# Patient Record
Sex: Female | Born: 1975 | Race: Black or African American | Hispanic: No | Smoking: Never smoker
Health system: Southern US, Community
[De-identification: ages and names within clinical notes are randomized; demographics above are authoritative.]

## PROBLEM LIST (undated history)

## (undated) DIAGNOSIS — R51 Headache: Secondary | ICD-10-CM

## (undated) DIAGNOSIS — E039 Hypothyroidism, unspecified: Secondary | ICD-10-CM

## (undated) DIAGNOSIS — L732 Hidradenitis suppurativa: Secondary | ICD-10-CM

## (undated) DIAGNOSIS — K219 Gastro-esophageal reflux disease without esophagitis: Secondary | ICD-10-CM

## (undated) DIAGNOSIS — J42 Unspecified chronic bronchitis: Secondary | ICD-10-CM

## (undated) DIAGNOSIS — K589 Irritable bowel syndrome without diarrhea: Secondary | ICD-10-CM

## (undated) DIAGNOSIS — F32A Depression, unspecified: Secondary | ICD-10-CM

## (undated) DIAGNOSIS — F419 Anxiety disorder, unspecified: Secondary | ICD-10-CM

## (undated) DIAGNOSIS — R519 Headache, unspecified: Secondary | ICD-10-CM

## (undated) DIAGNOSIS — F329 Major depressive disorder, single episode, unspecified: Secondary | ICD-10-CM

## (undated) HISTORY — DX: Hidradenitis suppurativa: L73.2

## (undated) HISTORY — PX: DILATION AND CURETTAGE OF UTERUS: SHX78

## (undated) HISTORY — PX: HERNIA REPAIR: SHX51

## (undated) HISTORY — DX: Hypothyroidism, unspecified: E03.9

## (undated) HISTORY — DX: Major depressive disorder, single episode, unspecified: F32.9

## (undated) HISTORY — DX: Anxiety disorder, unspecified: F41.9

## (undated) HISTORY — DX: Irritable bowel syndrome, unspecified: K58.9

## (undated) HISTORY — DX: Depression, unspecified: F32.A

---

## 2011-06-13 HISTORY — PX: ABDOMINAL HYSTERECTOMY: SHX81

## 2013-06-12 HISTORY — PX: ABDOMINAL HYSTERECTOMY: SHX81

## 2017-08-01 ENCOUNTER — Other Ambulatory Visit: Payer: Self-pay | Admitting: Physician Assistant

## 2017-08-01 DIAGNOSIS — R110 Nausea: Secondary | ICD-10-CM

## 2017-08-01 DIAGNOSIS — R1031 Right lower quadrant pain: Secondary | ICD-10-CM

## 2017-08-01 DIAGNOSIS — R1032 Left lower quadrant pain: Secondary | ICD-10-CM

## 2017-08-01 DIAGNOSIS — R1013 Epigastric pain: Secondary | ICD-10-CM

## 2017-08-09 ENCOUNTER — Ambulatory Visit
Admission: RE | Admit: 2017-08-09 | Discharge: 2017-08-09 | Disposition: A | Discharge status: Home / Self Care | Payer: BLUE CROSS/BLUE SHIELD | Source: Ambulatory Visit | Attending: Physician Assistant | Admitting: Physician Assistant

## 2017-08-09 DIAGNOSIS — R1031 Right lower quadrant pain: Secondary | ICD-10-CM

## 2017-08-09 DIAGNOSIS — R1032 Left lower quadrant pain: Secondary | ICD-10-CM

## 2017-08-09 DIAGNOSIS — R1013 Epigastric pain: Secondary | ICD-10-CM

## 2017-08-09 DIAGNOSIS — R110 Nausea: Secondary | ICD-10-CM

## 2017-08-09 MED ORDER — IOPAMIDOL (ISOVUE-300) INJECTION 61%
125.0000 mL | Freq: Once | INTRAVENOUS | Status: AC | PRN
  Administered 2017-08-09: 125 mL via INTRAVENOUS

## 2017-08-14 ENCOUNTER — Other Ambulatory Visit: Payer: Self-pay | Admitting: Physician Assistant

## 2017-08-14 DIAGNOSIS — K449 Diaphragmatic hernia without obstruction or gangrene: Secondary | ICD-10-CM

## 2017-08-15 ENCOUNTER — Ambulatory Visit
Admission: RE | Admit: 2017-08-15 | Discharge: 2017-08-15 | Disposition: A | Discharge status: Home / Self Care | Payer: BLUE CROSS/BLUE SHIELD | Source: Ambulatory Visit | Attending: Physician Assistant | Admitting: Physician Assistant

## 2017-08-15 DIAGNOSIS — K449 Diaphragmatic hernia without obstruction or gangrene: Secondary | ICD-10-CM

## 2017-08-17 ENCOUNTER — Institutional Professional Consult (permissible substitution): Payer: BLUE CROSS/BLUE SHIELD | Admitting: Cardiothoracic Surgery

## 2017-08-17 ENCOUNTER — Encounter: Payer: Self-pay | Admitting: Cardiothoracic Surgery

## 2017-08-17 VITALS — BP 140/90 | HR 80 | Resp 20 | Ht 61.0 in | Wt 215.0 lb

## 2017-08-17 DIAGNOSIS — K449 Diaphragmatic hernia without obstruction or gangrene: Secondary | ICD-10-CM | POA: Diagnosis not present
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
## 2017-08-20 ENCOUNTER — Other Ambulatory Visit: Payer: Self-pay | Admitting: *Deleted

## 2017-08-20 DIAGNOSIS — K449 Diaphragmatic hernia without obstruction or gangrene: Secondary | ICD-10-CM

## 2017-08-31 DIAGNOSIS — K449 Diaphragmatic hernia without obstruction or gangrene: Secondary | ICD-10-CM | POA: Insufficient documentation

## 2017-09-07 NOTE — Pre-Procedure Instructions (Signed)
Candi LeashKandeiss Cowley  09/07/2017      [facility] [phone] [place], [place] - [address] [facility] [address] [place] [id number] Phone: [phone] Fax: [phone]    Your procedure is scheduled on Wednesday April 3.  [facility].M.  Call this number if you have problems the morning of surgery:  [phone]   Remember:  Do not eat food or drink liquids after midnight.  Take these medicines the morning of surgery with A SIP OF WATER:  Levothyroxine (synthroid) Omeprazole (prilosec) Sertraline (zoloft)  7 days prior to surgery STOP taking any Aspirin(unless otherwise instructed by your surgeon), Aleve, Naproxen, Ibuprofen, Motrin, Advil, Goody's, BC's, all herbal medications, fish oil, and all vitamins    Do not wear jewelry, make-up or nail polish.  Do not wear lotions, powders, or perfumes, or deodorant.  Do not shave 48 hours prior to surgery.  Men may shave face and neck.  Do not bring valuables to the hospital.  [facility] is not responsible for any belongings or valuables.  Contacts, dentures or bridgework may not be worn into surgery.  Leave your suitcase in the car.  After surgery it may be brought to your room.  For patients admitted to the hospital, discharge time will be determined by your treatment team.  Patients discharged the day of surgery will not be allowed to drive home.   Special instructions:    [facility]- Preparing For Surgery  Before surgery, you can play an important role. Because skin is not sterile, your skin needs to be as free of germs as possible. You can reduce the number of germs on your skin by washing with CHG (chlorahexidine gluconate) Soap before surgery.  CHG is an antiseptic cleaner which kills germs and bonds with the skin to continue killing germs even after washing.  Please do not use if you have an allergy to CHG or antibacterial  soaps. If your skin becomes reddened/irritated stop using the CHG.  Do not shave (including legs and underarms) for at least 48 hours prior to first CHG shower. It is OK to shave your face.  Please follow these instructions carefully.   1. Shower the NIGHT BEFORE SURGERY and the MORNING OF SURGERY with CHG.   2. If you chose to wash your hair, wash your hair first as usual with your normal shampoo.  3. After you shampoo, rinse your hair and body thoroughly to remove the shampoo.  4. Use CHG as you would any other liquid soap. You can apply CHG directly to the skin and wash gently with a scrungie or a clean washcloth.   5. Apply the CHG Soap to your body ONLY FROM THE NECK DOWN.  Do not use on open wounds or open sores. Avoid contact with your eyes, ears, mouth and genitals (private parts). Wash Face and genitals (private parts)  with your normal soap.  6. Wash thoroughly, paying special attention to the area where your surgery will be performed.  7. Thoroughly rinse your body with warm water from the neck down.  8. DO NOT shower/wash with your normal soap after using and rinsing off the CHG Soap.  9. Pat yourself dry with a CLEAN TOWEL.  10. Wear CLEAN PAJAMAS to bed the night before surgery, wear comfortable clothes the morning of surgery  11. Place CLEAN SHEETS on your bed the night of  your first shower and DO NOT SLEEP WITH PETS.    Day of Surgery: Do not apply any deodorants/lotions. Please wear clean clothes to the hospital/surgery center.      Please read over the following fact sheets that you were given. Coughing and Deep Breathing, MRSA Information and Surgical Site Infection Prevention

## 2017-09-10 ENCOUNTER — Encounter (HOSPITAL_COMMUNITY): Payer: Self-pay | Admitting: *Deleted

## 2017-09-10 ENCOUNTER — Ambulatory Visit (HOSPITAL_COMMUNITY)
Admission: RE | Admit: 2017-09-10 | Discharge: 2017-09-10 | Disposition: A | Discharge status: Home / Self Care | Payer: BLUE CROSS/BLUE SHIELD | Source: Ambulatory Visit | Attending: Cardiothoracic Surgery | Admitting: Cardiothoracic Surgery

## 2017-09-10 ENCOUNTER — Other Ambulatory Visit: Payer: Self-pay

## 2017-09-10 ENCOUNTER — Encounter (HOSPITAL_COMMUNITY)
Admission: RE | Admit: 2017-09-10 | Discharge: 2017-09-10 | Disposition: A | Discharge status: Home / Self Care | Payer: BLUE CROSS/BLUE SHIELD | Source: Ambulatory Visit | Attending: Cardiothoracic Surgery | Admitting: Cardiothoracic Surgery

## 2017-09-10 DIAGNOSIS — Z01818 Encounter for other preprocedural examination: Secondary | ICD-10-CM | POA: Diagnosis present

## 2017-09-10 DIAGNOSIS — Z0183 Encounter for blood typing: Secondary | ICD-10-CM | POA: Insufficient documentation

## 2017-09-10 DIAGNOSIS — Z01812 Encounter for preprocedural laboratory examination: Secondary | ICD-10-CM | POA: Diagnosis not present

## 2017-09-10 DIAGNOSIS — K449 Diaphragmatic hernia without obstruction or gangrene: Secondary | ICD-10-CM | POA: Diagnosis not present

## 2017-09-10 HISTORY — DX: Gastro-esophageal reflux disease without esophagitis: K21.9

## 2017-09-10 LAB — BLOOD GAS, ARTERIAL
Acid-base deficit: 1.3 mmol/L (ref 0.0–2.0)
Bicarbonate: 22.1 mmol/L (ref 20.0–28.0)
Drawn by: 421801
FIO2: 21
O2 Saturation: 98.9 %
Patient temperature: 98.6
pCO2 arterial: 32 mmHg (ref 32.0–48.0)
pH, Arterial: 7.455 — ABNORMAL HIGH (ref 7.350–7.450)
pO2, Arterial: 130 mmHg — ABNORMAL HIGH (ref 83.0–108.0)

## 2017-09-10 LAB — CBC
HCT: 38.2 % (ref 36.0–46.0)
Hemoglobin: 12.5 g/dL (ref 12.0–15.0)
MCH: 29.8 pg (ref 26.0–34.0)
MCHC: 32.7 g/dL (ref 30.0–36.0)
MCV: 91.2 fL (ref 78.0–100.0)
Platelets: 254 10*3/uL (ref 150–400)
RBC: 4.19 MIL/uL (ref 3.87–5.11)
RDW: 13.7 % (ref 11.5–15.5)
WBC: 11.7 10*3/uL — ABNORMAL HIGH (ref 4.0–10.5)

## 2017-09-10 LAB — URINALYSIS, ROUTINE W REFLEX MICROSCOPIC
Bilirubin Urine: NEGATIVE
Glucose, UA: NEGATIVE mg/dL
Hgb urine dipstick: NEGATIVE
Ketones, ur: NEGATIVE mg/dL
Leukocytes, UA: NEGATIVE
Nitrite: NEGATIVE
Protein, ur: NEGATIVE mg/dL
Specific Gravity, Urine: 1.023 (ref 1.005–1.030)
pH: 6 (ref 5.0–8.0)

## 2017-09-10 LAB — TYPE AND SCREEN
ABO/RH(D): A POS
Antibody Screen: NEGATIVE

## 2017-09-10 LAB — COMPREHENSIVE METABOLIC PANEL
ALT: 19 U/L (ref 14–54)
AST: 25 U/L (ref 15–41)
Albumin: 3.8 g/dL (ref 3.5–5.0)
Alkaline Phosphatase: 52 U/L (ref 38–126)
Anion gap: 9 (ref 5–15)
BUN: 8 mg/dL (ref 6–20)
CO2: 20 mmol/L — ABNORMAL LOW (ref 22–32)
Calcium: 9.4 mg/dL (ref 8.9–10.3)
Chloride: 107 mmol/L (ref 101–111)
Creatinine, Ser: 0.71 mg/dL (ref 0.44–1.00)
GFR calc Af Amer: >60 mL/min (ref >60)
GFR calc non Af Amer: >60 mL/min (ref >60)
Glucose, Bld: 114 mg/dL — ABNORMAL HIGH (ref 65–99)
Potassium: 3.7 mmol/L (ref 3.5–5.1)
Sodium: 136 mmol/L (ref 135–145)
Total Bilirubin: 0.6 mg/dL (ref 0.3–1.2)
Total Protein: 7.9 g/dL (ref 6.5–8.1)

## 2017-09-10 LAB — PROTIME-INR
INR: 1
Prothrombin Time: 13.1 seconds (ref 11.4–15.2)

## 2017-09-10 LAB — ABO/RH: ABO/RH(D): A POS

## 2017-09-10 LAB — SURGICAL PCR SCREEN
MRSA, PCR: POSITIVE — AB
Staphylococcus aureus: POSITIVE — AB

## 2017-09-10 LAB — APTT: aPTT: 30 seconds (ref 24–36)

## 2017-09-10 NOTE — Progress Notes (Addendum)
PCP: NONE  Cardiologist: none  [facility]up[facility]cin scrip call to [facility] [address]

## 2017-09-11 ENCOUNTER — Other Ambulatory Visit: Payer: Self-pay | Admitting: *Deleted

## 2017-09-11 DIAGNOSIS — K449 Diaphragmatic hernia without obstruction or gangrene: Secondary | ICD-10-CM

## 2017-09-18 MED ORDER — CEFAZOLIN SODIUM-DEXTROSE 2-4 GM/100ML-% IV SOLN
2.0000 g | INTRAVENOUS | Status: DC
  Filled 2017-09-18: qty 100

## 2017-09-19 ENCOUNTER — Other Ambulatory Visit: Payer: Self-pay

## 2017-09-19 ENCOUNTER — Encounter (HOSPITAL_COMMUNITY): Payer: Self-pay | Admitting: Urology

## 2017-09-19 ENCOUNTER — Inpatient Hospital Stay (HOSPITAL_COMMUNITY)
Admission: RE | Admit: 2017-09-19 | Discharge: 2017-09-22 | DRG: 327 | Disposition: A | Discharge status: Home / Self Care | Payer: BLUE CROSS/BLUE SHIELD | Source: Ambulatory Visit | Attending: Cardiothoracic Surgery | Admitting: Cardiothoracic Surgery

## 2017-09-19 ENCOUNTER — Inpatient Hospital Stay (HOSPITAL_COMMUNITY): Payer: BLUE CROSS/BLUE SHIELD | Admitting: Anesthesiology

## 2017-09-19 ENCOUNTER — Inpatient Hospital Stay (HOSPITAL_COMMUNITY): Payer: BLUE CROSS/BLUE SHIELD

## 2017-09-19 ENCOUNTER — Encounter (HOSPITAL_COMMUNITY)
Admission: RE | Disposition: A | Discharge status: Home / Self Care | Payer: Self-pay | Source: Ambulatory Visit | Attending: Cardiothoracic Surgery

## 2017-09-19 DIAGNOSIS — E669 Obesity, unspecified: Secondary | ICD-10-CM | POA: Diagnosis present

## 2017-09-19 DIAGNOSIS — L732 Hidradenitis suppurativa: Secondary | ICD-10-CM | POA: Diagnosis present

## 2017-09-19 DIAGNOSIS — Z888 Allergy status to other drugs, medicaments and biological substances status: Secondary | ICD-10-CM

## 2017-09-19 DIAGNOSIS — E039 Hypothyroidism, unspecified: Secondary | ICD-10-CM | POA: Diagnosis present

## 2017-09-19 DIAGNOSIS — D62 Acute posthemorrhagic anemia: Secondary | ICD-10-CM | POA: Diagnosis present

## 2017-09-19 DIAGNOSIS — I10 Essential (primary) hypertension: Secondary | ICD-10-CM | POA: Diagnosis present

## 2017-09-19 DIAGNOSIS — Z90721 Acquired absence of ovaries, unilateral: Secondary | ICD-10-CM | POA: Diagnosis not present

## 2017-09-19 DIAGNOSIS — K219 Gastro-esophageal reflux disease without esophagitis: Secondary | ICD-10-CM | POA: Diagnosis present

## 2017-09-19 DIAGNOSIS — Z4682 Encounter for fitting and adjustment of non-vascular catheter: Secondary | ICD-10-CM

## 2017-09-19 DIAGNOSIS — F329 Major depressive disorder, single episode, unspecified: Secondary | ICD-10-CM | POA: Diagnosis present

## 2017-09-19 DIAGNOSIS — D649 Anemia, unspecified: Secondary | ICD-10-CM | POA: Diagnosis not present

## 2017-09-19 DIAGNOSIS — Z90710 Acquired absence of both cervix and uterus: Secondary | ICD-10-CM | POA: Diagnosis not present

## 2017-09-19 DIAGNOSIS — Z6841 Body Mass Index (BMI) 40.0 and over, adult: Secondary | ICD-10-CM

## 2017-09-19 DIAGNOSIS — K589 Irritable bowel syndrome without diarrhea: Secondary | ICD-10-CM | POA: Diagnosis present

## 2017-09-19 DIAGNOSIS — J9811 Atelectasis: Secondary | ICD-10-CM | POA: Diagnosis not present

## 2017-09-19 DIAGNOSIS — Z01818 Encounter for other preprocedural examination: Secondary | ICD-10-CM

## 2017-09-19 DIAGNOSIS — F419 Anxiety disorder, unspecified: Secondary | ICD-10-CM | POA: Diagnosis present

## 2017-09-19 DIAGNOSIS — K449 Diaphragmatic hernia without obstruction or gangrene: Secondary | ICD-10-CM | POA: Diagnosis not present

## 2017-09-19 DIAGNOSIS — Z9689 Presence of other specified functional implants: Secondary | ICD-10-CM

## 2017-09-19 HISTORY — DX: Headache, unspecified: R51.9

## 2017-09-19 HISTORY — DX: Unspecified chronic bronchitis: J42

## 2017-09-19 HISTORY — PX: VIDEO ASSISTED THORACOSCOPY: SHX5073

## 2017-09-19 HISTORY — PX: VIDEO BRONCHOSCOPY: SHX5072

## 2017-09-19 HISTORY — DX: Headache: R51

## 2017-09-19 HISTORY — PX: REPAIR OF ESOPHAGUS: SHX6062

## 2017-09-19 HISTORY — PX: DIAPHRAGMATIC HERNIA REPAIR: SUR1167

## 2017-09-19 LAB — CBC
HCT: 37.4 % (ref 36.0–46.0)
Hemoglobin: 12.1 g/dL (ref 12.0–15.0)
MCH: 29.5 pg (ref 26.0–34.0)
MCHC: 32.4 g/dL (ref 30.0–36.0)
MCV: 91.2 fL (ref 78.0–100.0)
Platelets: 325 10*3/uL (ref 150–400)
RBC: 4.1 MIL/uL (ref 3.87–5.11)
RDW: 13.8 % (ref 11.5–15.5)
WBC: 9.8 10*3/uL (ref 4.0–10.5)

## 2017-09-19 LAB — BASIC METABOLIC PANEL
Anion gap: 10 (ref 5–15)
BUN: 7 mg/dL (ref 6–20)
CO2: 21 mmol/L — ABNORMAL LOW (ref 22–32)
Calcium: 9.2 mg/dL (ref 8.9–10.3)
Chloride: 105 mmol/L (ref 101–111)
Creatinine, Ser: 0.92 mg/dL (ref 0.44–1.00)
GFR calc Af Amer: >60 mL/min (ref >60)
GFR calc non Af Amer: >60 mL/min (ref >60)
Glucose, Bld: 103 mg/dL — ABNORMAL HIGH (ref 65–99)
Potassium: 3.7 mmol/L (ref 3.5–5.1)
Sodium: 136 mmol/L (ref 135–145)

## 2017-09-19 SURGERY — BRONCHOSCOPY, VIDEO-ASSISTED
Anesthesia: General | Site: Chest | Laterality: Right

## 2017-09-19 MED ORDER — LEVALBUTEROL HCL 0.63 MG/3ML IN NEBU
0.6300 mg | INHALATION_SOLUTION | Freq: Three times a day (TID) | RESPIRATORY_TRACT | Status: DC
  Administered 2017-09-20 – 2017-09-21 (×3): 0.63 mg via RESPIRATORY_TRACT
  Filled 2017-09-19 (×4): qty 3

## 2017-09-19 MED ORDER — BUPIVACAINE 0.5 % ON-Q PUMP SINGLE CATH 400 ML
400.0000 mL | INJECTION | Status: AC
  Administered 2017-09-19: 400 mL
  Filled 2017-09-19: qty 400

## 2017-09-19 MED ORDER — CHLORHEXIDINE GLUCONATE CLOTH 2 % EX PADS
6.0000 | MEDICATED_PAD | Freq: Every day | CUTANEOUS | Status: DC
  Administered 2017-09-19 – 2017-09-21 (×3): 6 via TOPICAL

## 2017-09-19 MED ORDER — ONDANSETRON HCL 4 MG/2ML IJ SOLN
INTRAMUSCULAR | Status: AC
  Filled 2017-09-19: qty 2

## 2017-09-19 MED ORDER — BUPIVACAINE HCL (PF) 0.5 % IJ SOLN
INTRAMUSCULAR | Status: AC
  Filled 2017-09-19: qty 10

## 2017-09-19 MED ORDER — 0.9 % SODIUM CHLORIDE (POUR BTL) OPTIME
TOPICAL | Status: DC | PRN
  Administered 2017-09-19: 1000 mL

## 2017-09-19 MED ORDER — HYDROMORPHONE HCL 1 MG/ML IJ SOLN
INTRAMUSCULAR | Status: AC
  Administered 2017-09-19: 0.5 mg via INTRAVENOUS
  Filled 2017-09-19: qty 1

## 2017-09-19 MED ORDER — HYDROXYZINE HCL 10 MG PO TABS
10.0000 mg | ORAL_TABLET | Freq: Three times a day (TID) | ORAL | Status: DC | PRN
  Filled 2017-09-19: qty 1

## 2017-09-19 MED ORDER — METOPROLOL TARTRATE 12.5 MG HALF TABLET
12.5000 mg | ORAL_TABLET | Freq: Two times a day (BID) | ORAL | Status: DC
  Administered 2017-09-19 – 2017-09-22 (×6): 12.5 mg via ORAL
  Filled 2017-09-19 (×6): qty 1

## 2017-09-19 MED ORDER — ROCURONIUM BROMIDE 10 MG/ML (PF) SYRINGE
PREFILLED_SYRINGE | INTRAVENOUS | Status: AC
  Filled 2017-09-19: qty 10

## 2017-09-19 MED ORDER — ONDANSETRON HCL 4 MG/2ML IJ SOLN
INTRAMUSCULAR | Status: DC | PRN
  Administered 2017-09-19: 4 mg via INTRAVENOUS

## 2017-09-19 MED ORDER — FENTANYL 40 MCG/ML IV SOLN
INTRAVENOUS | Status: DC
  Administered 2017-09-19: 300 ug via INTRAVENOUS
  Administered 2017-09-19: 420 ug via INTRAVENOUS
  Administered 2017-09-19: 1000 ug via INTRAVENOUS
  Administered 2017-09-20: 165 ug via INTRAVENOUS
  Administered 2017-09-20: 45 ug via INTRAVENOUS
  Administered 2017-09-20: 105 ug via INTRAVENOUS
  Administered 2017-09-20: 40 mL via INTRAVENOUS
  Administered 2017-09-21: 90 ug via INTRAVENOUS
  Administered 2017-09-21 (×2): 60 ug via INTRAVENOUS
  Filled 2017-09-19 (×3): qty 25

## 2017-09-19 MED ORDER — PHENYLEPHRINE HCL 10 MG/ML IJ SOLN
INTRAVENOUS | Status: DC | PRN
  Administered 2017-09-19: 10 ug/min via INTRAVENOUS

## 2017-09-19 MED ORDER — OXYCODONE HCL 5 MG PO TABS
5.0000 mg | ORAL_TABLET | ORAL | Status: DC | PRN
  Administered 2017-09-19 – 2017-09-21 (×6): 10 mg via ORAL
  Administered 2017-09-21: 5 mg via ORAL
  Administered 2017-09-21 – 2017-09-22 (×3): 10 mg via ORAL
  Filled 2017-09-19 (×9): qty 2
  Filled 2017-09-19: qty 1

## 2017-09-19 MED ORDER — DEXAMETHASONE SODIUM PHOSPHATE 10 MG/ML IJ SOLN
INTRAMUSCULAR | Status: DC | PRN
  Administered 2017-09-19: 10 mg via INTRAVENOUS

## 2017-09-19 MED ORDER — MIDAZOLAM HCL 2 MG/2ML IJ SOLN
INTRAMUSCULAR | Status: AC
  Filled 2017-09-19: qty 2

## 2017-09-19 MED ORDER — ROCURONIUM BROMIDE 100 MG/10ML IV SOLN
INTRAVENOUS | Status: DC | PRN
  Administered 2017-09-19: 20 mg via INTRAVENOUS
  Administered 2017-09-19: 30 mg via INTRAVENOUS
  Administered 2017-09-19: 10 mg via INTRAVENOUS
  Administered 2017-09-19: 50 mg via INTRAVENOUS
  Administered 2017-09-19 (×2): 20 mg via INTRAVENOUS

## 2017-09-19 MED ORDER — POTASSIUM CHLORIDE 10 MEQ/50ML IV SOLN: 10.0000 meq | Freq: Every day | INTRAVENOUS | Status: DC | PRN

## 2017-09-19 MED ORDER — SODIUM CHLORIDE 0.9% FLUSH: 10.0000 mL | INTRAVENOUS | Status: DC | PRN

## 2017-09-19 MED ORDER — BUPIVACAINE HCL (PF) 0.5 % IJ SOLN
INTRAMUSCULAR | Status: DC | PRN
  Administered 2017-09-19: 10 mL

## 2017-09-19 MED ORDER — BISACODYL 5 MG PO TBEC
10.0000 mg | DELAYED_RELEASE_TABLET | Freq: Every day | ORAL | Status: DC
  Administered 2017-09-20 – 2017-09-21 (×2): 10 mg via ORAL
  Filled 2017-09-19 (×3): qty 2

## 2017-09-19 MED ORDER — PROPOFOL 10 MG/ML IV BOLUS
INTRAVENOUS | Status: AC
  Filled 2017-09-19: qty 20

## 2017-09-19 MED ORDER — DEXAMETHASONE SODIUM PHOSPHATE 10 MG/ML IJ SOLN
INTRAMUSCULAR | Status: AC
  Filled 2017-09-19: qty 1

## 2017-09-19 MED ORDER — ONDANSETRON HCL 4 MG/2ML IJ SOLN
4.0000 mg | Freq: Four times a day (QID) | INTRAMUSCULAR | Status: DC | PRN
  Filled 2017-09-19: qty 2

## 2017-09-19 MED ORDER — FENTANYL CITRATE (PF) 250 MCG/5ML IJ SOLN
INTRAMUSCULAR | Status: AC
Start: 2017-09-19 — End: 2017-09-19
  Filled 2017-09-19: qty 5

## 2017-09-19 MED ORDER — VANCOMYCIN HCL IN DEXTROSE 1-5 GM/200ML-% IV SOLN
1000.0000 mg | Freq: Once | INTRAVENOUS | Status: AC
  Administered 2017-09-19: 1000 mg via INTRAVENOUS
  Filled 2017-09-19: qty 200

## 2017-09-19 MED ORDER — MIDAZOLAM HCL 5 MG/5ML IJ SOLN
INTRAMUSCULAR | Status: DC | PRN
  Administered 2017-09-19: 2 mg via INTRAVENOUS

## 2017-09-19 MED ORDER — LEVALBUTEROL HCL 0.63 MG/3ML IN NEBU
0.6300 mg | INHALATION_SOLUTION | Freq: Four times a day (QID) | RESPIRATORY_TRACT | Status: DC
  Administered 2017-09-19: 0.63 mg via RESPIRATORY_TRACT
  Filled 2017-09-19: qty 3

## 2017-09-19 MED ORDER — HYDROMORPHONE HCL 1 MG/ML IJ SOLN: 0.5000 mg | INTRAMUSCULAR | Status: DC | PRN

## 2017-09-19 MED ORDER — LACTATED RINGERS IV SOLN
INTRAVENOUS | Status: DC | PRN
  Administered 2017-09-19: 08:00:00 via INTRAVENOUS

## 2017-09-19 MED ORDER — TRAMADOL HCL 50 MG PO TABS
50.0000 mg | ORAL_TABLET | Freq: Four times a day (QID) | ORAL | Status: DC | PRN
  Administered 2017-09-20: 100 mg via ORAL
  Filled 2017-09-19: qty 2

## 2017-09-19 MED ORDER — FENTANYL CITRATE (PF) 250 MCG/5ML IJ SOLN
INTRAMUSCULAR | Status: AC
  Filled 2017-09-19: qty 5

## 2017-09-19 MED ORDER — DEXTROSE-NACL 5-0.45 % IV SOLN
INTRAVENOUS | Status: DC
  Administered 2017-09-19 – 2017-09-21 (×3): via INTRAVENOUS

## 2017-09-19 MED ORDER — SODIUM CHLORIDE 0.9% FLUSH: 9.0000 mL | INTRAVENOUS | Status: DC | PRN

## 2017-09-19 MED ORDER — PANTOPRAZOLE SODIUM 40 MG PO TBEC
40.0000 mg | DELAYED_RELEASE_TABLET | Freq: Every day | ORAL | Status: DC
  Administered 2017-09-19 – 2017-09-22 (×4): 40 mg via ORAL
  Filled 2017-09-19 (×4): qty 1

## 2017-09-19 MED ORDER — LEVOTHYROXINE SODIUM 75 MCG PO TABS
75.0000 ug | ORAL_TABLET | Freq: Every day | ORAL | Status: DC
  Administered 2017-09-20 – 2017-09-22 (×3): 75 ug via ORAL
  Filled 2017-09-19 (×3): qty 1

## 2017-09-19 MED ORDER — HYDROMORPHONE HCL 1 MG/ML IJ SOLN
0.2500 mg | INTRAMUSCULAR | Status: DC | PRN
  Administered 2017-09-19 (×6): 0.5 mg via INTRAVENOUS

## 2017-09-19 MED ORDER — LIDOCAINE 2% (20 MG/ML) 5 ML SYRINGE
INTRAMUSCULAR | Status: AC
  Filled 2017-09-19: qty 5

## 2017-09-19 MED ORDER — OXYCODONE HCL 5 MG/5ML PO SOLN: 5.0000 mg | Freq: Once | ORAL | Status: DC | PRN

## 2017-09-19 MED ORDER — SODIUM CHLORIDE 0.9% FLUSH
10.0000 mL | Freq: Two times a day (BID) | INTRAVENOUS | Status: DC
  Administered 2017-09-19 – 2017-09-21 (×4): 10 mL

## 2017-09-19 MED ORDER — ONDANSETRON HCL 4 MG/2ML IJ SOLN: 4.0000 mg | Freq: Four times a day (QID) | INTRAMUSCULAR | Status: DC | PRN

## 2017-09-19 MED ORDER — METOCLOPRAMIDE HCL 5 MG/ML IJ SOLN
10.0000 mg | Freq: Four times a day (QID) | INTRAMUSCULAR | Status: AC
  Administered 2017-09-19 – 2017-09-20 (×4): 10 mg via INTRAVENOUS
  Filled 2017-09-19 (×4): qty 2

## 2017-09-19 MED ORDER — FENTANYL CITRATE (PF) 100 MCG/2ML IJ SOLN
INTRAMUSCULAR | Status: DC | PRN
  Administered 2017-09-19: 50 ug via INTRAVENOUS
  Administered 2017-09-19: 25 ug via INTRAVENOUS
  Administered 2017-09-19: 75 ug via INTRAVENOUS
  Administered 2017-09-19: 150 ug via INTRAVENOUS
  Administered 2017-09-19 (×9): 50 ug via INTRAVENOUS

## 2017-09-19 MED ORDER — ACETAMINOPHEN 160 MG/5ML PO SOLN
1000.0000 mg | Freq: Four times a day (QID) | ORAL | Status: DC
  Administered 2017-09-21: 1000 mg via ORAL
  Filled 2017-09-19: qty 40.6

## 2017-09-19 MED ORDER — PROPOFOL 10 MG/ML IV BOLUS
INTRAVENOUS | Status: DC | PRN
  Administered 2017-09-19: 150 mg via INTRAVENOUS
  Administered 2017-09-19: 50 mg via INTRAVENOUS

## 2017-09-19 MED ORDER — SERTRALINE HCL 100 MG PO TABS
100.0000 mg | ORAL_TABLET | Freq: Every day | ORAL | Status: DC
  Administered 2017-09-20 – 2017-09-22 (×3): 100 mg via ORAL
  Filled 2017-09-19 (×3): qty 1

## 2017-09-19 MED ORDER — OXYCODONE HCL 5 MG PO TABS: 5.0000 mg | ORAL_TABLET | Freq: Once | ORAL | Status: DC | PRN

## 2017-09-19 MED ORDER — VANCOMYCIN HCL IN DEXTROSE 1-5 GM/200ML-% IV SOLN
1000.0000 mg | Freq: Two times a day (BID) | INTRAVENOUS | Status: AC
  Administered 2017-09-19: 1000 mg via INTRAVENOUS
  Filled 2017-09-19: qty 200

## 2017-09-19 MED ORDER — SUGAMMADEX SODIUM 500 MG/5ML IV SOLN
INTRAVENOUS | Status: AC
  Filled 2017-09-19: qty 5

## 2017-09-19 MED ORDER — NALOXONE HCL 0.4 MG/ML IJ SOLN: 0.4000 mg | INTRAMUSCULAR | Status: DC | PRN

## 2017-09-19 MED ORDER — ACETAMINOPHEN 500 MG PO TABS
1000.0000 mg | ORAL_TABLET | Freq: Four times a day (QID) | ORAL | Status: DC
  Administered 2017-09-19 – 2017-09-22 (×10): 1000 mg via ORAL
  Filled 2017-09-19 (×10): qty 2

## 2017-09-19 MED ORDER — DIPHENHYDRAMINE HCL 12.5 MG/5ML PO ELIX
12.5000 mg | ORAL_SOLUTION | Freq: Four times a day (QID) | ORAL | Status: DC | PRN
  Filled 2017-09-19: qty 5

## 2017-09-19 MED ORDER — ORAL CARE MOUTH RINSE
15.0000 mL | Freq: Two times a day (BID) | OROMUCOSAL | Status: DC
  Administered 2017-09-19 – 2017-09-21 (×3): 15 mL via OROMUCOSAL

## 2017-09-19 MED ORDER — SUGAMMADEX SODIUM 500 MG/5ML IV SOLN
INTRAVENOUS | Status: DC | PRN
  Administered 2017-09-19: 400 mg via INTRAVENOUS

## 2017-09-19 MED ORDER — SENNOSIDES-DOCUSATE SODIUM 8.6-50 MG PO TABS
1.0000 | ORAL_TABLET | Freq: Every day | ORAL | Status: DC
  Administered 2017-09-19 – 2017-09-21 (×3): 1 via ORAL
  Filled 2017-09-19 (×3): qty 1

## 2017-09-19 MED ORDER — DIPHENHYDRAMINE HCL 50 MG/ML IJ SOLN: 12.5000 mg | Freq: Four times a day (QID) | INTRAMUSCULAR | Status: DC | PRN

## 2017-09-19 SURGICAL SUPPLY — 92 items
APPLICATOR TIP COSEAL (VASCULAR PRODUCTS) IMPLANT
APPLICATOR TIP EXT COSEAL (VASCULAR PRODUCTS) IMPLANT
BIT DRILL 11X150 CANN QR (BIT) ×4 IMPLANT
BLADE SURG 11 STRL SS (BLADE) IMPLANT
BRUSH CYTOL CELLEBRITY 1.5X140 (MISCELLANEOUS) IMPLANT
CANISTER SUCT 3000ML PPV (MISCELLANEOUS) ×4 IMPLANT
CATH KIT ON Q 5IN SLV (PAIN MANAGEMENT) IMPLANT
CATH KIT ON-Q SILVERSOAK 5IN (CATHETERS) ×4 IMPLANT
CATH THORACIC 28FR (CATHETERS) IMPLANT
CATH THORACIC 36FR (CATHETERS) IMPLANT
CATH THORACIC 36FR RT ANG (CATHETERS) IMPLANT
CELLS DAT CNTRL 66122 CELL SVR (MISCELLANEOUS) ×3 IMPLANT
CLEANER TIP ELECTROSURG 2X2 (MISCELLANEOUS) ×4 IMPLANT
CLIP VESOCCLUDE MED 6/CT (CLIP) IMPLANT
CONN ST 1/4X3/8  BEN (MISCELLANEOUS) ×1
CONN ST 1/4X3/8 BEN (MISCELLANEOUS) ×3 IMPLANT
CONT SPEC 4OZ CLIKSEAL STRL BL (MISCELLANEOUS) ×16 IMPLANT
COVER BACK TABLE 60X90IN (DRAPES) ×4 IMPLANT
DERMABOND ADVANCED (GAUZE/BANDAGES/DRESSINGS)
DERMABOND ADVANCED .7 DNX12 (GAUZE/BANDAGES/DRESSINGS) IMPLANT
DRAIN CHANNEL 28F RND 3/8 FF (WOUND CARE) ×4 IMPLANT
DRAPE LAPAROSCOPIC ABDOMINAL (DRAPES) ×4 IMPLANT
DRAPE WARM FLUID 44X44 (DRAPE) ×4 IMPLANT
DRESSING AQUACEL AG SP 3.5X10 (GAUZE/BANDAGES/DRESSINGS) ×3 IMPLANT
DRILL BIT 7/64X5 (BIT) IMPLANT
DRSG AQUACEL AG SP 3.5X10 (GAUZE/BANDAGES/DRESSINGS) ×4
ELECT BLADE 4.0 EZ CLEAN MEGAD (MISCELLANEOUS) ×8
ELECT REM PT RETURN 9FT ADLT (ELECTROSURGICAL) ×4
ELECTRODE BLDE 4.0 EZ CLN MEGD (MISCELLANEOUS) ×6 IMPLANT
ELECTRODE REM PT RTRN 9FT ADLT (ELECTROSURGICAL) ×3 IMPLANT
FORCEPS BIOP RJ4 1.8 (CUTTING FORCEPS) IMPLANT
GAUZE SPONGE 4X4 12PLY STRL (GAUZE/BANDAGES/DRESSINGS) ×4 IMPLANT
GAUZE SPONGE 4X4 12PLY STRL LF (GAUZE/BANDAGES/DRESSINGS) ×4 IMPLANT
GLOVE BIO SURGEON STRL SZ 6.5 (GLOVE) ×32 IMPLANT
GOWN STRL REUS W/ TWL LRG LVL3 (GOWN DISPOSABLE) ×15 IMPLANT
GOWN STRL REUS W/TWL LRG LVL3 (GOWN DISPOSABLE) ×5
KIT BASIN OR (CUSTOM PROCEDURE TRAY) ×4 IMPLANT
KIT CLEAN ENDO COMPLIANCE (KITS) ×4 IMPLANT
KIT SUCTION CATH 14FR (SUCTIONS) ×4 IMPLANT
KIT TURNOVER KIT B (KITS) ×4 IMPLANT
MARKER SKIN DUAL TIP RULER LAB (MISCELLANEOUS) ×4 IMPLANT
MESH VENTRALIGHT ST 6X8 (Mesh Specialty) ×1 IMPLANT
MESH VENTRLGHT ELLIPSE 8X6XMFL (Mesh Specialty) ×3 IMPLANT
NDL SUT 1 .5 CRC FRENCH EYE (NEEDLE) ×3 IMPLANT
NEEDLE FRENCH EYE (NEEDLE) ×1
NS IRRIG 1000ML POUR BTL (IV SOLUTION) ×8 IMPLANT
OIL SILICONE PENTAX (PARTS (SERVICE/REPAIRS)) ×4 IMPLANT
PACK CHEST (CUSTOM PROCEDURE TRAY) ×4 IMPLANT
PAD ARMBOARD 7.5X6 YLW CONV (MISCELLANEOUS) ×8 IMPLANT
PASSER SUT SWANSON 36MM LOOP (INSTRUMENTS) ×4 IMPLANT
RTRCTR WOUND ALEXIS 18CM MED (MISCELLANEOUS) ×4
RTRCTR WOUND ALEXIS 18CM SML (INSTRUMENTS) ×4
SAVER CELL AAL HAEMONETICS (INSTRUMENTS) ×3 IMPLANT
SEALANT PROGEL (MISCELLANEOUS) IMPLANT
SEALANT SURG COSEAL 4ML (VASCULAR PRODUCTS) IMPLANT
SEALANT SURG COSEAL 8ML (VASCULAR PRODUCTS) IMPLANT
SOLUTION ANTI FOG 6CC (MISCELLANEOUS) ×4 IMPLANT
SPONGE TONSIL 1 RF SGL (DISPOSABLE) ×4 IMPLANT
SUT NOVA NAB GS-21 0 18 T12 DT (SUTURE) ×16 IMPLANT
SUT PROLENE 0 CT 1 30 (SUTURE) ×48 IMPLANT
SUT PROLENE 0 CT 1 CR/8 (SUTURE) ×4 IMPLANT
SUT PROLENE 3 0 SH DA (SUTURE) IMPLANT
SUT PROLENE 4 0 RB 1 (SUTURE)
SUT PROLENE 4-0 RB1 .5 CRCL 36 (SUTURE) IMPLANT
SUT SILK  1 MH (SUTURE) ×19
SUT SILK 1 MH (SUTURE) ×57 IMPLANT
SUT SILK 2 0 SH CR/8 (SUTURE) ×4 IMPLANT
SUT SILK 2 0SH CR/8 30 (SUTURE) IMPLANT
SUT SILK 3 0SH CR/8 30 (SUTURE) IMPLANT
SUT VIC AB 1 CTX 18 (SUTURE) ×8 IMPLANT
SUT VIC AB 1 CTX 36 (SUTURE)
SUT VIC AB 1 CTX36XBRD ANBCTR (SUTURE) IMPLANT
SUT VIC AB 2-0 CTX 36 (SUTURE) ×4 IMPLANT
SUT VIC AB 3-0 X1 27 (SUTURE) IMPLANT
SUT VICRYL 2 TP 1 (SUTURE) ×4 IMPLANT
SWAB COLLECTION DEVICE MRSA (MISCELLANEOUS) IMPLANT
SWAB CULTURE ESWAB REG 1ML (MISCELLANEOUS) IMPLANT
SYR 20ML ECCENTRIC (SYRINGE) ×4 IMPLANT
SYSTEM SAHARA CHEST DRAIN ATS (WOUND CARE) ×4 IMPLANT
TAPE CLOTH 4X10 WHT NS (GAUZE/BANDAGES/DRESSINGS) ×4 IMPLANT
TAPE CLOTH SURG 4X10 WHT LF (GAUZE/BANDAGES/DRESSINGS) ×4 IMPLANT
TIP APPLICATOR SPRAY EXTEND 16 (VASCULAR PRODUCTS) IMPLANT
TOWEL GREEN STERILE (TOWEL DISPOSABLE) ×4 IMPLANT
TOWEL GREEN STERILE FF (TOWEL DISPOSABLE) ×4 IMPLANT
TRAP SPECIMEN MUCOUS 40CC (MISCELLANEOUS) IMPLANT
TRAY FOLEY CATH SILVER 16FR LF (SET/KITS/TRAYS/PACK) ×4 IMPLANT
TROCAR BLADELESS 12MM (ENDOMECHANICALS) IMPLANT
TROCAR XCEL BLUNT TIP 100MML (ENDOMECHANICALS) ×4 IMPLANT
TUBE CONNECTING 20X1/4 (TUBING) ×4 IMPLANT
TUNNELER SHEATH ON-Q 11GX8 DSP (PAIN MANAGEMENT) ×4 IMPLANT
VALVE DISPOSABLE (MISCELLANEOUS) ×4 IMPLANT
WATER STERILE IRR 1000ML POUR (IV SOLUTION) ×8 IMPLANT

## 2017-09-19 NOTE — Anesthesia Procedure Notes (Signed)
Procedure Name: Intubation Date/Time: 09/19/2017 8:37 AM Performed by: Kyung Rudd, CRNA Pre-anesthesia Checklist: Patient identified, Emergency Drugs available, Suction available and Patient being monitored Patient Re-evaluated:Patient Re-evaluated prior to induction Oxygen Delivery Method: Circle system utilized Preoxygenation: Pre-oxygenation with 100% oxygen Induction Type: IV induction Ventilation: Mask ventilation without difficulty Laryngoscope Size: Mac and 4 Grade View: Grade I Tube type: Oral Endobronchial tube: Left, Double lumen EBT, EBT position confirmed by auscultation and EBT position confirmed by fiberoptic bronchoscope and 37 Fr Number of attempts: 1 Airway Equipment and Method: Stylet and Bite block Placement Confirmation: ETT inserted through vocal cords under direct vision,  positive ETCO2 and breath sounds checked- equal and bilateral Secured at: 28 cm Tube secured with: Tape Dental Injury: Teeth and Oropharynx as per pre-operative assessment
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
## 2017-09-19 NOTE — Brief Op Note (Addendum)
      [address]       [place] [id number]             [phone]      09/19/2017  1:32 PM  PATIENT:  Candi LeashKandeiss Brandau  42 y.o. female  PRE-OPERATIVE DIAGNOSIS:  RIGHT DIAPHRAGMATIC HERNIA  POST-OPERATIVE DIAGNOSIS:  RIGHT DIAPHRAGMATIC HERNIA  PROCEDURE:  Procedure(s):  VIDEO BRONCHOSCOPY (N/A) VIDEO ASSISTED THORACOSCOPY/MINI THORACOTOMY REPAIR OF DIAPHRAGMATIC HERNIA (Right)  SURGEON:  Surgeon(s) and Role:    * Delight OvensGerhardt, Louisa Favaro B, MD - Primary  PHYSICIAN ASSISTANT: Erin Barrett PA-C  ANESTHESIA:   general  EBL:  50 mL   BLOOD ADMINISTERED:none  DRAINS: 28 Blake Drain  LOCAL MEDICATIONS USED:  BUPIVICAINE   SPECIMEN:  No Specimen  DISPOSITION OF SPECIMEN:  N/A  COUNTS:  YES  TOURNIQUET:  * No tourniquets in log *  DICTATION: .Dragon Dictation  PLAN OF CARE: Admit to inpatient   PATIENT DISPOSITION:  ICU - extubated and stable.   Delay start of Pharmacological VTE agent (>24hrs) due to surgical blood loss or risk of bleeding: yes

## 2017-09-19 NOTE — Anesthesia Preprocedure Evaluation (Addendum)
Anesthesia Evaluation  Patient identified by MRN, date of birth, ID band Patient awake    Reviewed: Allergy & Precautions, H&P , NPO status , Patient's Chart, lab work & pertinent test results  Airway Mallampati: II   Neck ROM: full    Dental  (+) Teeth Intact, Caps,    Pulmonary neg pulmonary ROS,    breath sounds clear to auscultation       Cardiovascular negative cardio ROS   Rhythm:regular Rate:Normal     Neuro/Psych PSYCHIATRIC DISORDERS Anxiety Depression  Neuromuscular disease    GI/Hepatic GERD  ,Diaphragmatic hernia   Endo/Other  Hypothyroidism   Renal/GU      Musculoskeletal   Abdominal   Peds  Hematology   Anesthesia Other Findings   Reproductive/Obstetrics                            Anesthesia Physical Anesthesia Plan  ASA: II  Anesthesia Plan: General   Post-op Pain Management:    Induction: Intravenous  PONV Risk Score and Plan: 3 and Ondansetron, Dexamethasone, Midazolam and Treatment may vary due to age or medical condition  Airway Management Planned: Double Lumen EBT  Additional Equipment: CVP and Ultrasound Guidance Line Placement  Intra-op Plan:   Post-operative Plan: Extubation in OR  Informed Consent: I have reviewed the patients History and Physical, chart, labs and discussed the procedure including the risks, benefits and alternatives for the proposed anesthesia with the patient or authorized representative who has indicated his/her understanding and acceptance.     Plan Discussed with: CRNA, Anesthesiologist and Surgeon  Anesthesia Plan Comments:         Anesthesia Quick Evaluation

## 2017-09-19 NOTE — Anesthesia Procedure Notes (Signed)
Central Venous Catheter Insertion Performed by: Val EagleMoser, Calvina Liptak, MD, anesthesiologist Start/End4/03/2018 7:48 AM, 09/19/2017 7:52 AM Patient location: Pre-op. Preanesthetic checklist: patient identified, IV checked, site marked, risks and benefits discussed, surgical consent, monitors and equipment checked, pre-op evaluation, timeout performed and anesthesia consent Position: Trendelenburg Lidocaine 1% used for infiltration and patient sedated Hand hygiene performed  and maximum sterile barriers used  Catheter size: 8 Fr Total catheter length 16. Central line was placed.Double lumen Procedure performed using ultrasound guided technique. Ultrasound Notes:image(s) printed for medical record Attempts: 1 Following insertion, dressing applied, line sutured and Biopatch. Post procedure assessment: blood return through all ports, free fluid flow and no air  Patient tolerated the procedure well with no immediate complications.

## 2017-09-19 NOTE — Progress Notes (Signed)
Patient ID: Candi LeashKandeiss Chittick, female   DOB: 02/13/1976, 42 y.o.   MRN: [id number] EVENING ROUNDS NOTE :     [address]       [place] [id number]             [phone]                 Day of Surgery Procedure(s) (LRB): VIDEO BRONCHOSCOPY (N/A) VIDEO ASSISTED THORACOSCOPY (Right) REPAIR OF DIAPHRAGMATIC HERNIA (Right)  Total Length of Stay:  LOS: 0 days  BP (!) 152/96   Pulse 77   Temp 98.7 F (37.1 C) (Oral)   Resp (!) 21   Wt 213 lb (96.6 kg)   LMP  (LMP Unknown)   SpO2 100%   BMI 40.25 kg/m   .Intake/Output      04/09 0701 - 04/10 0700 04/10 0701 - 04/11 0700   I.V. (mL/kg)  1668.3 (17.3)   Total Intake(mL/kg)  1668.3 (17.3)   Urine (mL/kg/hr)  550 (0.5)   Drains  40   Blood  50   Chest Tube  90   Total Output  730   Net  +938.3          . dextrose 5 % and 0.45% NaCl 100 mL/hr at 09/19/17 1800  . potassium chloride    . vancomycin       Lab Results  Component Value Date   WBC 9.8 09/19/2017   HGB 12.1 09/19/2017   HCT 37.4 09/19/2017   PLT 325 09/19/2017   GLUCOSE 103 (H) 09/19/2017   ALT 19 09/10/2017   AST 25 09/10/2017   NA 136 09/19/2017   K 3.7 09/19/2017   CL 105 09/19/2017   CREATININE 0.92 09/19/2017   BUN 7 09/19/2017   CO2 21 (L) 09/19/2017   INR 1.00 09/10/2017   Stable post op Low chest drainage  BP elevated , start low dose beta blocker    Delight OvensEdward B Cicley Ganesh MD  Beeper [phone] Office [phone] 09/19/2017 6:31 PM

## 2017-09-19 NOTE — Anesthesia Procedure Notes (Signed)
Arterial Line Insertion Start/End4/03/2018 7:55 AM, 09/19/2017 8:00 AM Performed by: Quentin OreWalker, Kaysen Deal E, CRNA, CRNA  Patient location: Pre-op. Preanesthetic checklist: patient identified, IV checked, site marked, risks and benefits discussed, surgical consent, monitors and equipment checked, pre-op evaluation and timeout performed Lidocaine 1% used for infiltration and patient sedated Left, radial was placed Catheter size: 20 G Hand hygiene performed , maximum sterile barriers used  and Seldinger technique used Allen's test indicative of satisfactory collateral circulation Attempts: 1 Procedure performed without using ultrasound guided technique. Following insertion, Biopatch and dressing applied. Post procedure assessment: normal  Patient tolerated the procedure well with no immediate complications.

## 2017-09-19 NOTE — Anesthesia Postprocedure Evaluation (Signed)
Anesthesia Post Note  Patient: Katelyn Arnold  Procedure(s) Performed: VIDEO BRONCHOSCOPY (N/A ) VIDEO ASSISTED THORACOSCOPY (Right Chest) REPAIR OF DIAPHRAGMATIC HERNIA (Right )     Patient location during evaluation: PACU Anesthesia Type: General Level of consciousness: awake and alert Pain management: pain level controlled Vital Signs Assessment: post-procedure vital signs reviewed and stable Respiratory status: spontaneous breathing, nonlabored ventilation, respiratory function stable and patient connected to nasal cannula oxygen Cardiovascular status: blood pressure returned to baseline and stable Postop Assessment: no apparent nausea or vomiting Anesthetic complications: no    Last Vitals:  Vitals:   09/19/17 1622 09/19/17 1630  BP:  (!) 152/96  Pulse:  84  Resp:  (!) 26  Temp: 37.1 C   SpO2:  98%    Last Pain:  Vitals:   09/19/17 1622  TempSrc: Oral  PainSc:                  Hatsumi Steinhart S

## 2017-09-19 NOTE — Transfer of Care (Signed)
Immediate Anesthesia Transfer of Care Note  Patient: Katelyn Arnold  Procedure(s) Performed: VIDEO BRONCHOSCOPY (N/A ) VIDEO ASSISTED THORACOSCOPY (Right Chest) REPAIR OF DIAPHRAGMATIC HERNIA (Right )  Patient Location: PACU  Anesthesia Type:General  Level of Consciousness: awake, alert  and oriented  Airway & Oxygen Therapy: Patient Spontanous Breathing and Patient connected to face mask oxygen  Post-op Assessment: Report given to RN, Post -op Vital signs reviewed and stable and Patient moving all extremities X 4  Post vital signs: Reviewed and stable  Last Vitals:  Vitals Value Taken Time  BP 144/91 09/19/2017  1:59 PM  Temp 36.8 C 09/19/2017  2:00 PM  Pulse 77 09/19/2017  2:08 PM  Resp 23 09/19/2017  2:08 PM  SpO2 100 % 09/19/2017  2:08 PM  Vitals shown include unvalidated device data.  Last Pain:  Vitals:   09/19/17 0702  PainSc: 0-No pain         Complications: No apparent anesthesia complications

## 2017-09-20 ENCOUNTER — Inpatient Hospital Stay (HOSPITAL_COMMUNITY): Payer: BLUE CROSS/BLUE SHIELD

## 2017-09-20 ENCOUNTER — Encounter (HOSPITAL_COMMUNITY): Payer: Self-pay | Admitting: Cardiothoracic Surgery

## 2017-09-20 LAB — CBC
HCT: 36.7 % (ref 36.0–46.0)
Hemoglobin: 11.9 g/dL — ABNORMAL LOW (ref 12.0–15.0)
MCH: 29.8 pg (ref 26.0–34.0)
MCHC: 32.4 g/dL (ref 30.0–36.0)
MCV: 91.8 fL (ref 78.0–100.0)
PLATELETS: 344 10*3/uL (ref 150–400)
RBC: 4 MIL/uL (ref 3.87–5.11)
RDW: 14 % (ref 11.5–15.5)
WBC: 20.2 10*3/uL — ABNORMAL HIGH (ref 4.0–10.5)

## 2017-09-20 LAB — BASIC METABOLIC PANEL
Anion gap: 9 (ref 5–15)
CHLORIDE: 104 mmol/L (ref 101–111)
CO2: 23 mmol/L (ref 22–32)
CREATININE: 0.75 mg/dL (ref 0.44–1.00)
Calcium: 8.8 mg/dL — ABNORMAL LOW (ref 8.9–10.3)
GFR calc Af Amer: >60 mL/min (ref >60)
Glucose, Bld: 136 mg/dL — ABNORMAL HIGH (ref 65–99)
Potassium: 3.5 mmol/L (ref 3.5–5.1)
SODIUM: 136 mmol/L (ref 135–145)

## 2017-09-20 LAB — POCT I-STAT 3, ART BLOOD GAS (G3+)
Acid-base deficit: 2 mmol/L (ref 0.0–2.0)
Bicarbonate: 23.5 mmol/L (ref 20.0–28.0)
O2 Saturation: 100 %
Patient temperature: 98.1
TCO2: 25 mmol/L (ref 22–32)
pCO2 arterial: 42.5 mmHg (ref 32.0–48.0)
pH, Arterial: 7.349 — ABNORMAL LOW (ref 7.350–7.450)
pO2, Arterial: 223 mmHg — ABNORMAL HIGH (ref 83.0–108.0)

## 2017-09-20 MED ORDER — ENOXAPARIN SODIUM 40 MG/0.4ML ~~LOC~~ SOLN
40.0000 mg | SUBCUTANEOUS | Status: DC
  Administered 2017-09-20 – 2017-09-22 (×3): 40 mg via SUBCUTANEOUS
  Filled 2017-09-20 (×3): qty 0.4

## 2017-09-20 NOTE — Progress Notes (Signed)
Report called to 2C 

## 2017-09-20 NOTE — Care Management Note (Signed)
Case Management Note Donn PieriniKristi Emmelia Holdsworth RN, BSN Unit 4E-Case Manager-- 2H coverage [phone]  Patient Details  Name: Katelyn Arnold MRN: [id number] Date of Birth: 05/13/1976  Subjective/Objective:  Pt admitted s/p VIDEO BRONCHOSCOPY (N/A) VIDEO ASSISTED THORACOSCOPY (Right) REPAIR OF DIAPHRAGMATIC HERNIA                  Action/Plan: PTA pt lived at home- independent- anticipate return home- CM to follow for transition of care needs.   Expected Discharge Date:                  Expected Discharge Plan:  Home/Self Care  In-House Referral:     Discharge planning Services  CM Consult  Post Acute Care Choice:    Choice offered to:     DME Arranged:    DME Agency:     HH Arranged:    HH Agency:     Status of Service:  In process, will continue to follow  If discussed at Long Length of Stay Meetings, dates discussed:    Discharge Disposition:   Additional Comments:  Darrold SpanWebster, Wanda Rideout Hall, RN 09/20/2017, 12:07 PM

## 2017-09-20 NOTE — Progress Notes (Signed)
Patient ID: Katelyn Arnold, female   DOB: [birth year], 42 y.o.   MRN: [id number] [facility] DAILY ICU PROGRESS NOTE                   [address]            [name] [address]          [phone]   1 Day Post-Op Procedure(s) (LRB): VIDEO BRONCHOSCOPY (N/A) VIDEO ASSISTED THORACOSCOPY (Right) REPAIR OF DIAPHRAGMATIC HERNIA (Right)  Total Length of Stay:  LOS: 1 day   Subjective: First day postop patient resting comfortably, is not been ambulating yet  Objective: Vital signs in last 24 hours: Temp:  [97.5 F (36.4 C)-98.8 F (37.1 C)] 98.7 F (37.1 C) (04/11 0745) Pulse Rate:  [66-93] 66 (04/11 0745) Cardiac Rhythm: Normal sinus rhythm (04/11 0425) Resp:  [11-33] 20 (04/11 0745) BP: (107-158)/(68-109) 107/68 (04/11 0745) SpO2:  [96 %-100 %] 96 % (04/11 0745) Arterial Line BP: (147-190)/(80-104) 159/85 (04/11 0500) Weight:  [215 lb (97.5 kg)] 215 lb (97.5 kg) (04/11 0539)  Filed Weights   09/19/17 0654 09/20/17 0539  Weight: 213 lb (96.6 kg) 215 lb (97.5 kg)    Weight change: 2 lb (0.907 kg)   Hemodynamic parameters for last 24 hours:    Intake/Output from previous day: 04/10 0701 - 04/11 0700 In: 2793.3 [I.V.:2793.3] Out: 2925 [Urine:2650; Drains:40; Blood:50; Chest Tube:185]  Intake/Output this shift: No intake/output data recorded.  Current Meds: Scheduled Meds: . acetaminophen  1,000 mg Oral Q6H   Or  . acetaminophen (TYLENOL) oral liquid 160 mg/5 mL  1,000 mg Oral Q6H  . bisacodyl  10 mg Oral Daily  . Chlorhexidine Gluconate Cloth  6 each Topical Daily  . fentaNYL   Intravenous Q4H  . levalbuterol  0.63 mg Nebulization TID  . levothyroxine  75 mcg Oral QAC breakfast  . mouth rinse  15 mL Mouth Rinse BID  . metoCLOPramide (REGLAN) injection  10 mg Intravenous Q6H  . metoprolol tartrate  12.5 mg Oral BID  . pantoprazole  40 mg Oral Daily  . senna-docusate  1 tablet Oral QHS  . sertraline  100 mg Oral Daily  . sodium chloride flush  10-40  mL Intracatheter Q12H   Continuous Infusions: . dextrose 5 % and 0.45% NaCl 100 mL/hr at 09/20/17 0449  . potassium chloride     PRN Meds:.diphenhydrAMINE **OR** diphenhydrAMINE, hydrOXYzine, naloxone **AND** sodium chloride flush, ondansetron (ZOFRAN) IV, ondansetron (ZOFRAN) IV, oxyCODONE, potassium chloride, sodium chloride flush, traMADol  General appearance: alert, cooperative and no distress Neurologic: intact Heart: regular rate and rhythm, S1, S2 normal, no murmur, click, rub or gallop Lungs: diminished breath sounds bibasilar Abdomen: soft, non-tender; bowel sounds normal; no masses,  no organomegaly Extremities: extremities normal, atraumatic, no cyanosis or edema and Homans sign is negative, no sign of DVT Wound: No air leak from chest tube dressings intact  Lab Results: CBC: Recent Labs    09/19/17 0719 09/20/17 0454  WBC 9.8 20.2*  HGB 12.1 11.9*  HCT 37.4 36.7  PLT 325 344   BMET:  Recent Labs    09/19/17 0719 09/20/17 0454  NA 136 136  K 3.7 3.5  CL 105 104  CO2 21* 23  GLUCOSE 103* 136*  BUN 7 <5*  CREATININE 0.92 0.75  CALCIUM 9.2 8.8*    CMET: Lab Results  Component Value Date   WBC 20.2 (H) 09/20/2017   HGB 11.9 (L) 09/20/2017   HCT 36.7 09/20/2017  PLT 344 09/20/2017   GLUCOSE 136 (H) 09/20/2017   ALT 19 09/10/2017   AST 25 09/10/2017   NA 136 09/20/2017   K 3.5 09/20/2017   CL 104 09/20/2017   CREATININE 0.75 09/20/2017   BUN <5 (L) 09/20/2017   CO2 23 09/20/2017   INR 1.00 09/10/2017      PT/INR: No results for input(s): LABPROT, INR in the last 72 hours. Radiology: Dg Chest Port 1 View  Result Date: 09/19/2017 CLINICAL DATA:  Status post diaphragmatic hernia repair EXAM: PORTABLE CHEST 1 VIEW COMPARISON:  Chest radiograph obtained earlier in the day; September 10, 2017 FINDINGS: Central catheter tip is in the superior vena cava. There is a chest tube on the right. No pneumothorax. There is postoperative change at the right base.  There is a small right pleural effusion. There is new airspace consolidation in the right upper lobe. Left lung is clear. Heart size and pulmonary vascularity are normal. No adenopathy. No bone lesions. IMPRESSION: Tube and catheter positions as described without pneumothorax. New consolidation right upper lobe, likely pneumonia. Small right pleural effusion. Postoperative change right base noted. Left lung remains clear. Electronically Signed   By: Bretta BangWilliam  Woodruff III M.D.   On: 09/19/2017 14:44     Assessment/Plan: S/P Procedure(s) (LRB): VIDEO BRONCHOSCOPY (N/A) VIDEO ASSISTED THORACOSCOPY (Right) REPAIR OF DIAPHRAGMATIC HERNIA (Right) Mobilize Expected Acute  Blood - loss Anemia- continue to monitor  Renal function stable, blood pressure better controlled on low-dose data blocker Chest tube to waterseal DC A-line and Foley   Delight OvensEdward B Jordyn Doane 09/20/2017 8:15 AM

## 2017-09-20 NOTE — Progress Notes (Signed)
Transferred patient to 2 N via bed with telmetry. All personal belongings taken

## 2017-09-20 NOTE — Progress Notes (Signed)
Wasted 2 ml of fentanyl PCA syringe with Bronson CurbHannah Padgett RN

## 2017-09-20 NOTE — Plan of Care (Signed)
Patient was in significant pain during early part of night.  Added oral pain medications and pain was much better managed.  Chest tube output was minimal through night and changed to serosanguinous by morning.  Chest tube site and dressing have no complications or drainage over night.

## 2017-09-21 ENCOUNTER — Inpatient Hospital Stay (HOSPITAL_COMMUNITY): Payer: BLUE CROSS/BLUE SHIELD

## 2017-09-21 LAB — CBC
HCT: 34.7 % — ABNORMAL LOW (ref 36.0–46.0)
HEMOGLOBIN: 11.2 g/dL — AB (ref 12.0–15.0)
MCH: 30.3 pg (ref 26.0–34.0)
MCHC: 32.3 g/dL (ref 30.0–36.0)
MCV: 93.8 fL (ref 78.0–100.0)
PLATELETS: 321 10*3/uL (ref 150–400)
RBC: 3.7 MIL/uL — AB (ref 3.87–5.11)
RDW: 14.4 % (ref 11.5–15.5)
WBC: 20 10*3/uL — AB (ref 4.0–10.5)

## 2017-09-21 LAB — COMPREHENSIVE METABOLIC PANEL
ALK PHOS: 43 U/L (ref 38–126)
ALT: 29 U/L (ref 14–54)
AST: 42 U/L — AB (ref 15–41)
Albumin: 3.1 g/dL — ABNORMAL LOW (ref 3.5–5.0)
Anion gap: 8 (ref 5–15)
BUN: 5 mg/dL — AB (ref 6–20)
CALCIUM: 8.7 mg/dL — AB (ref 8.9–10.3)
CHLORIDE: 102 mmol/L (ref 101–111)
CO2: 25 mmol/L (ref 22–32)
CREATININE: 0.8 mg/dL (ref 0.44–1.00)
GFR calc Af Amer: >60 mL/min (ref >60)
GFR calc non Af Amer: >60 mL/min (ref >60)
Glucose, Bld: 124 mg/dL — ABNORMAL HIGH (ref 65–99)
Potassium: 3.7 mmol/L (ref 3.5–5.1)
SODIUM: 135 mmol/L (ref 135–145)
Total Bilirubin: 0.7 mg/dL (ref 0.3–1.2)
Total Protein: 7.2 g/dL (ref 6.5–8.1)

## 2017-09-21 MED ORDER — LEVALBUTEROL HCL 0.63 MG/3ML IN NEBU
0.6300 mg | INHALATION_SOLUTION | Freq: Two times a day (BID) | RESPIRATORY_TRACT | Status: DC
  Administered 2017-09-21 – 2017-09-22 (×2): 0.63 mg via RESPIRATORY_TRACT
  Filled 2017-09-21 (×2): qty 3
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
## 2017-09-21 NOTE — Progress Notes (Addendum)
      [address]       [place] [id number]             [phone]      2 Days Post-Op Procedure(s) (LRB): VIDEO BRONCHOSCOPY (N/A) VIDEO ASSISTED THORACOSCOPY (Right) REPAIR OF DIAPHRAGMATIC HERNIA (Right)   Subjective:  No new complaints.  Doing okay, ambulated around unit last night.  No BM  Objective: Vital signs in last 24 hours: Temp:  [97.9 F (36.6 C)-98.7 F (37.1 C)] 98.2 F (36.8 C) (04/12 0353) Pulse Rate:  [66-81] 77 (04/12 0353) Cardiac Rhythm: Normal sinus rhythm (04/12 0702) Resp:  [19-27] 23 (04/12 0100) BP: (104-151)/(61-86) 151/86 (04/12 0353) SpO2:  [92 %-99 %] 92 % (04/12 0353) Arterial Line BP: (125-145)/(73-82) 145/82 (04/11 1000)  Intake/Output from previous day: 04/11 0701 - 04/12 0700 In: 1463 [P.O.:460; I.V.:1003] Out: 3125 [Urine:2875; Chest Tube:250] Intake/Output this shift: Total I/O In: -  Out: 325 [Urine:325]  General appearance: alert, cooperative and no distress Heart: regular rate and rhythm Lungs: clear to auscultation bilaterally Abdomen: soft, non-tender; bowel sounds normal; no masses,  no organomegaly Wound: clean and dry  Lab Results: Recent Labs    09/20/17 0454 09/21/17 0359  WBC 20.2* 20.0*  HGB 11.9* 11.2*  HCT 36.7 34.7*  PLT 344 321   BMET:  Recent Labs    09/20/17 0454 09/21/17 0359  NA 136 135  K 3.5 3.7  CL 104 102  CO2 23 25  GLUCOSE 136* 124*  BUN <5* 5*  CREATININE 0.75 0.80  CALCIUM 8.8* 8.7*    PT/INR: No results for input(s): LABPROT, INR in the last 72 hours. ABG    Component Value Date/Time   PHART 7.349 (L) 09/20/2017 0515   HCO3 23.5 09/20/2017 0515   TCO2 25 09/20/2017 0515   ACIDBASEDEF 2.0 09/20/2017 0515   O2SAT 100.0 09/20/2017 0515   CBG (last 3)  No results for input(s): GLUCAP in the last 72 hours.  Assessment/Plan: S/P Procedure(s) (LRB): VIDEO BRONCHOSCOPY (N/A) VIDEO ASSISTED THORACOSCOPY (Right) REPAIR OF DIAPHRAGMATIC HERNIA (Right)  1.  CV- NSR, BP controlled- continue Lopressor 2. Pulm- no acute issues, CT output is low 500 cc overnight, will d/c today... CXR with atelectasis bilaterally, encouraged patient to use IS aggressively and take good deep breaths 3. D/C Central Line 4. Decrease IV Fluids to [facility]-- patient tolerating oral intake, no need for IV fluid at 100 cc/hr 5. D/C PCA, ON-Q 6. dispo- patient stable, d/c chest tube, PCA, central line today, if CXR remains stable home in AM if patient remains stable   LOS: 2 days    Katelyn Arnold 09/21/2017  Chest tube out today , poss home one -two more  days  I have seen and examined Katelyn Arnold and agree with the above assessment  and plan.  Delight OvensEdward B Ramzey Petrovic MD [place] [phone] Office [phone] 09/21/2017 4:36 PM

## 2017-09-21 NOTE — Progress Notes (Signed)
Wasted 12 mL of Fentanyl PCA pump, Whitney RN witnessed in sink.

## 2017-09-21 NOTE — Discharge Instructions (Signed)
Discharge Instructions:  1. You may shower, please wash incisions daily with soap and water and keep dry.  If you wish to cover wounds with dressing you may do so but please keep clean and change daily.  No tub baths or swimming until incisions have completely healed.  If your incisions become red or develop any drainage please call our office at [phone]  2. No Driving for 2 weeks office and you are no longer using narcotic pain medications  3. Fever of 101.5 for at least 24 hours with no source, please contact our office at [phone]  4. Activity- up as tolerated, please walk at least 3 times per day.  Avoid strenuous activity, no lifting, pushing, or pulling with your arms over 8-10 lbs  5. If any questions or concerns arise, please do not hesitate to contact our office at [phone]

## 2017-09-22 ENCOUNTER — Inpatient Hospital Stay (HOSPITAL_COMMUNITY): Payer: BLUE CROSS/BLUE SHIELD

## 2017-09-22 ENCOUNTER — Other Ambulatory Visit: Payer: Self-pay | Admitting: Surgical

## 2017-09-22 MED ORDER — LEVALBUTEROL HCL 0.63 MG/3ML IN NEBU: 0.6300 mg | INHALATION_SOLUTION | Freq: Four times a day (QID) | RESPIRATORY_TRACT | Status: DC | PRN

## 2017-09-22 MED ORDER — METOPROLOL TARTRATE 25 MG PO TABS: 25.0000 mg | ORAL_TABLET | Freq: Two times a day (BID) | ORAL | 1 refills | Status: DC

## 2017-09-22 MED ORDER — OXYCODONE HCL 5 MG PO TABS: 5.0000 mg | ORAL_TABLET | Freq: Four times a day (QID) | ORAL | 0 refills | Status: DC | PRN

## 2017-09-22 NOTE — Plan of Care (Signed)
Pt. Received all discharge paperwork and instructions. All belongings with pt. At time of discharge.

## 2017-09-22 NOTE — Plan of Care (Signed)
  Problem: Education: Goal: Knowledge of General Education information will improve Outcome: Progressing   

## 2017-09-22 NOTE — Progress Notes (Addendum)
[address]       [place] [address]             [phone]      3 Days Post-Op Procedure(s) (LRB): VIDEO BRONCHOSCOPY (N/A) VIDEO ASSISTED THORACOSCOPY (Right) REPAIR OF DIAPHRAGMATIC HERNIA (Right) Subjective: Feels generally well  Objective: Vital signs in last 24 hours: Temp:  [98.6 F (37 C)-99.6 F (37.6 C)] 99.1 F (37.3 C) (04/13 0814) Pulse Rate:  [70-94] 82 (04/13 1022) Cardiac Rhythm: Normal sinus rhythm (04/13 0815) Resp:  [20-33] 20 (04/13 0907) BP: (131-158)/(80-94) 151/90 (04/13 1022) SpO2:  [95 %-100 %] 98 % (04/13 0907)  Hemodynamic parameters for last 24 hours:    Intake/Output from previous day: 04/12 0701 - 04/13 0700 In: 720 [P.O.:720] Out: 775 [Urine:775] Intake/Output this shift: Total I/O In: 240 [P.O.:240] Out: -   General appearance: alert, cooperative and no distress Heart: regular rate and rhythm Lungs: dim in right base Abdomen: benign Extremities: no edema or calf tenderness Wound: incis healing well  Lab Results: Recent Labs    09/20/17 0454 09/21/17 0359  WBC 20.2* 20.0*  HGB 11.9* 11.2*  HCT 36.7 34.7*  PLT 344 321   BMET:  Recent Labs    09/20/17 0454 09/21/17 0359  NA 136 135  K 3.5 3.7  CL 104 102  CO2 23 25  GLUCOSE 136* 124*  BUN <5* 5*  CREATININE 0.75 0.80  CALCIUM 8.8* 8.7*    PT/INR: No results for input(s): LABPROT, INR in the last 72 hours. ABG    Component Value Date/Time   PHART 7.349 (L) 09/20/2017 0515   HCO3 23.5 09/20/2017 0515   TCO2 25 09/20/2017 0515   ACIDBASEDEF 2.0 09/20/2017 0515   O2SAT 100.0 09/20/2017 0515   CBG (last 3)  No results for input(s): GLUCAP in the last 72 hours.  Meds Scheduled Meds: . acetaminophen  1,000 mg Oral Q6H   Or  . acetaminophen (TYLENOL) oral liquid 160 mg/5 mL  1,000 mg Oral Q6H  . bisacodyl  10 mg Oral Daily  . Chlorhexidine Gluconate Cloth  6 each Topical Daily  . enoxaparin (LOVENOX) injection  40 mg Subcutaneous  Q24H  . levothyroxine  75 mcg Oral QAC breakfast  . mouth rinse  15 mL Mouth Rinse BID  . metoprolol tartrate  12.5 mg Oral BID  . pantoprazole  40 mg Oral Daily  . senna-docusate  1 tablet Oral QHS  . sertraline  100 mg Oral Daily  . sodium chloride flush  10-40 mL Intracatheter Q12H   Continuous Infusions: . dextrose 5 % and 0.45% NaCl Stopped (09/21/17 1306)  . potassium chloride     PRN Meds:.hydrOXYzine, levalbuterol, oxyCODONE, potassium chloride, sodium chloride flush, traMADol  Xrays Dg Chest 2 View  Result Date: 09/22/2017 CLINICAL DATA:  Chest tube removal. EXAM: CHEST - 2 VIEW COMPARISON:  Chest x-rays dated 09/21/2017 and 09/20/2017. FINDINGS: RIGHT-sided chest tube has been removed. RIGHT IJ central line has also been removed. Ill-defined opacity at the RIGHT lung base is likely a combination of atelectasis and small pleural effusion. No pneumothorax seen. Heart size and mediastinal contours are stable.  LEFT lung is clear. IMPRESSION: Status post RIGHT-sided chest tube removal and RIGHT IJ central line removal. Probable atelectasis and/or small pleural effusion at the RIGHT lung base. No pneumothorax seen. Electronically Signed   By: Bary Richard M.D.   On: 09/22/2017 07:43   Dg Chest Port 1 View  Result Date: 09/21/2017  CLINICAL DATA:  Follow-up right chest tube EXAM: PORTABLE CHEST 1 VIEW COMPARISON:  09/20/2017 FINDINGS: Cardiac shadow is stable. Right jugular central line is noted. Right-sided thoracostomy catheter is seen without definitive pneumothorax. Elevation the right hemidiaphragm is again seen. No focal infiltrate is noted. IMPRESSION: Tubes and lines as described above. No definitive abnormality is identified. Electronically Signed   By: Alcide CleverMark  Lukens M.D.   On: 09/21/2017 09:00    Assessment/Plan: S/P Procedure(s) (LRB): VIDEO BRONCHOSCOPY (N/A) VIDEO ASSISTED THORACOSCOPY (Right) REPAIR OF DIAPHRAGMATIC HERNIA (Right)  1 conts to feel well 2 CXR is  stable 3 no new labs 4 ambulating and eating without problem 5 stable for discharge, will increase lopressor to 25 BID, she knows to F/U with a primary care physician long term for hypertension   LOS: 3 days    Rowe ClackWayne E Gold 09/22/2017
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
## 2017-10-02 ENCOUNTER — Other Ambulatory Visit: Payer: Self-pay

## 2017-10-02 MED ORDER — TRAMADOL HCL 50 MG PO TABS: 50.0000 mg | ORAL_TABLET | Freq: Four times a day (QID) | ORAL | 0 refills | Status: DC | PRN

## 2017-10-15 ENCOUNTER — Ambulatory Visit (INDEPENDENT_AMBULATORY_CARE_PROVIDER_SITE_OTHER): Payer: Self-pay | Admitting: Physician Assistant

## 2017-10-15 ENCOUNTER — Ambulatory Visit
Admission: RE | Admit: 2017-10-15 | Discharge: 2017-10-15 | Disposition: A | Discharge status: Home / Self Care | Payer: BLUE CROSS/BLUE SHIELD | Source: Ambulatory Visit | Attending: Cardiothoracic Surgery | Admitting: Cardiothoracic Surgery

## 2017-10-15 ENCOUNTER — Other Ambulatory Visit: Payer: Self-pay | Admitting: Cardiothoracic Surgery

## 2017-10-15 VITALS — BP 124/88 | HR 74 | Resp 20 | Ht 61.0 in | Wt 208.0 lb

## 2017-10-15 DIAGNOSIS — K449 Diaphragmatic hernia without obstruction or gangrene: Secondary | ICD-10-CM

## 2017-10-15 DIAGNOSIS — Z09 Encounter for follow-up examination after completed treatment for conditions other than malignant neoplasm: Secondary | ICD-10-CM

## 2017-10-15 NOTE — Progress Notes (Signed)
HPI: Patient returns for routine postoperative follow-up having undergone Right VATS with repair of diaphragmatic hernia on 09/19/2017.  The patient's early postoperative recovery while in the hospital was unremarkable.  Since hospital discharge the patient reports she is doing well.  However, she continues to have burning along her right chest and breast region.  She otherwise has no new complaints.  Her bowel habits have regulated.  She is eating and drinking without difficulty.   Current Outpatient Medications  Medication Sig Dispense Refill  . hydrOXYzine (ATARAX/VISTARIL) 10 MG tablet Take 10 mg by mouth 3 (three) times daily as needed for anxiety.     Marland Kitchen levothyroxine (SYNTHROID, LEVOTHROID) 75 MCG tablet Take 75 mcg by mouth daily before breakfast.    . metoprolol tartrate (LOPRESSOR) 25 MG tablet Take 1 tablet (25 mg total) by mouth 2 (two) times daily. 60 tablet 1  . omeprazole (PRILOSEC) 20 MG capsule Take 20 mg by mouth daily before breakfast.  2  . polyethylene glycol (MIRALAX / GLYCOLAX) packet Take 17 g by mouth every other day.    . sertraline (ZOLOFT) 100 MG tablet Take 100 mg by mouth daily.    . traMADol (ULTRAM) 50 MG tablet Take 1 tablet (50 mg total) by mouth every 6 (six) hours as needed for severe pain. 30 tablet 0   No current facility-administered medications for this visit.     Physical Exam:  BP 124/88   Pulse 74   Resp 20   Ht  (1.549 m)   Wt 208 lb (94.3 kg)   LMP  (LMP Unknown)   SpO2 98% Comment: RA  BMI 39.30 kg/m   Gen: no apparent distress Heart: RRR Lungs: CTA bilaterally Wound: clean and dry  Diagnostic Tests:  CXR: right sided atelectasis/pleural fluid small amount, no evidence of hernia  A/P:  1. S/P Repair of diaphragmatic hernia- doing well, some residual atelectasis and pleural fluid... Should resolve with time 2. Post thoracotomy nerve pain--- should also resolve with time, however should symptoms not improve over the next several  months may benefit from trial of Neurontin 3. RTC in 2 months with repeat CXR with EBG  Lowella Dandy, PA-C [facility] [phone]

## 2017-12-27 ENCOUNTER — Encounter: Payer: BLUE CROSS/BLUE SHIELD | Admitting: Cardiothoracic Surgery

## 2018-01-01 ENCOUNTER — Other Ambulatory Visit: Payer: Self-pay | Admitting: Cardiothoracic Surgery

## 2018-01-01 DIAGNOSIS — K440 Diaphragmatic hernia with obstruction, without gangrene: Secondary | ICD-10-CM

## 2018-01-03 ENCOUNTER — Encounter: Payer: BLUE CROSS/BLUE SHIELD | Admitting: Cardiothoracic Surgery

## 2018-01-04 ENCOUNTER — Ambulatory Visit (INDEPENDENT_AMBULATORY_CARE_PROVIDER_SITE_OTHER): Payer: Self-pay | Admitting: Cardiothoracic Surgery

## 2018-01-04 ENCOUNTER — Ambulatory Visit
Admission: RE | Admit: 2018-01-04 | Discharge: 2018-01-04 | Disposition: A | Discharge status: Home / Self Care | Payer: BLUE CROSS/BLUE SHIELD | Source: Ambulatory Visit | Attending: Cardiothoracic Surgery | Admitting: Cardiothoracic Surgery

## 2018-01-04 VITALS — BP 130/90 | HR 85 | Resp 20 | Ht 61.0 in | Wt 206.0 lb

## 2018-01-04 DIAGNOSIS — K440 Diaphragmatic hernia with obstruction, without gangrene: Secondary | ICD-10-CM

## 2018-01-04 DIAGNOSIS — Z09 Encounter for follow-up examination after completed treatment for conditions other than malignant neoplasm: Secondary | ICD-10-CM

## 2018-01-04 DIAGNOSIS — K449 Diaphragmatic hernia without obstruction or gangrene: Secondary | ICD-10-CM

## 2018-01-04 NOTE — Progress Notes (Signed)
[address]       [place] [id number]             [phone]      [facility] Record #[id number] Date of Birth: 05/04/1976  Referring: Celso AmyGarrett, Tina, PA-C Primary Care: Patient, No Pcp Per Primary Cardiologist: No primary care provider on file.   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  SURGICAL PROCEDURE:  Right video-assisted bronchoscopy, right video- assisted thoracoscopy, minithoracotomy, reduction of herniated liver into the abdomen with repair of diaphragmatic hernia with Marlex mesh patch.  SURGEON:  Sheliah PlaneEdward Broghan Pannone, MD.   History of Present Illness:     Patient returns with postop visit.  She is much improved from her preop state.  Notes that since surgery the epigastric discomfort has resolved.  She has minimal discomfort involving incision     Past Medical History:  Diagnosis Date  . Anxiety   . Chronic bronchitis (HCC)   . Depression   . GERD (gastroesophageal reflux disease)   . Headache    "monthly" (09/19/2017)  . Hidradenitis suppurativa   . Hypothyroidism   . Irritable bowel syndrome (IBS)      Social History   Tobacco Use  Smoking Status Never Smoker  Smokeless Tobacco Never Used    Social History   Substance and Sexual Activity  Alcohol Use Yes  . Alcohol/week: 1.2 oz  . Types: 2 Glasses of wine per week     Allergies  Allergen Reactions  . Compazine [Prochlorperazine Edisylate] Anaphylaxis    Current Outpatient Medications  Medication Sig Dispense Refill  . hydrOXYzine (ATARAX/VISTARIL) 10 MG tablet Take 10 mg by mouth 3 (three) times daily as needed for anxiety.     Katelyn Arnold. levothyroxine  (SYNTHROID, LEVOTHROID) 75 MCG tablet Take 75 mcg by mouth daily before breakfast.    . metoprolol tartrate (LOPRESSOR) 25 MG tablet Take 1 tablet (25 mg total) by mouth 2 (two) times daily. 60 tablet 1  . omeprazole (PRILOSEC) 20 MG capsule Take 20 mg by mouth daily before breakfast.  2  . polyethylene glycol (MIRALAX / GLYCOLAX) packet Take 17 g by mouth every other day.    . sertraline (ZOLOFT) 100 MG tablet Take 100 mg by mouth daily.    . traMADol (ULTRAM) 50 MG tablet Take 1 tablet (50 mg total) by mouth every 6 (six) hours as needed for severe pain. 30 tablet 0   No current facility-administered medications for this visit.        Physical Exam: BP 130/90   Pulse 85   Resp 20   Ht 5\' 1"  (1.549 m)   Wt 206 lb (93.4 kg)   LMP  (LMP Unknown)   SpO2 97% Comment: RA  BMI 38.92 kg/m   General appearance: alert and cooperative Neurologic: intact Heart: regular rate and rhythm, S1, S2 normal, no murmur, click, rub or gallop Lungs: clear to auscultation bilaterally Abdomen: soft, non-tender; bowel sounds normal; no masses,  no organomegaly Extremities: extremities normal, atraumatic, no cyanosis or edema and Homans sign is negative, no sign of  DVT Wound: Right chest incisions are well-healed well   Diagnostic Studies & Laboratory data:     Recent Radiology Findings:   Dg Chest 2 View  Result Date: 01/04/2018 CLINICAL DATA:  Diaphragmatic hernia with obstruction, without gangrene. Burning sensation under the right breast. EXAM: CHEST - 2 VIEW COMPARISON:  Two-view chest x-ray 10/15/2017. FINDINGS: The heart size is normal. Right middle lobe airspace disease has near completely resolved. Mild atelectasis is present. Lung volumes remain low. No other focal airspace disease is present. IMPRESSION: Near complete resolution of right middle lobe airspace disease. No acute abnormality. Electronically Signed   By: Marin Roberts M.D.   On: 01/04/2018 11:25   I have independently  reviewed the above radiology studies  and reviewed the findings with the patient.    Recent Lab Findings: Lab Results  Component Value Date   WBC 20.0 (H) 09/21/2017   HGB 11.2 (L) 09/21/2017   HCT 34.7 (L) 09/21/2017   PLT 321 09/21/2017   GLUCOSE 124 (H) 09/21/2017   ALT 29 09/21/2017   AST 42 (H) 09/21/2017   NA 135 09/21/2017   K 3.7 09/21/2017   CL 102 09/21/2017   CREATININE 0.80 09/21/2017   BUN 5 (L) 09/21/2017   CO2 25 09/21/2017   INR 1.00 09/10/2017      Assessment / Plan:      Patient stable following repair of right diaphragmatic hernia with reduction of incarcerated liver Symptomatically and radiographically the patient is markedly improved, she is pleased with the result We will plan to see her back as necessary   Delight Ovens MD      [address] [place] [place] [address] Office [phone]   Beeper [phone]  01/04/2018 12:11 PM

## 2018-01-04 NOTE — Addendum Note (Signed)
Addended by: Sheliah PlaneGERHARDT, Tokiko Diefenderfer B on: 01/04/2018 01:20 PM   Modules accepted: Level of Service

## 2018-09-12 ENCOUNTER — Ambulatory Visit (HOSPITAL_COMMUNITY)
Admission: EM | Admit: 2018-09-12 | Discharge: 2018-09-12 | Disposition: A | Discharge status: Home / Self Care | Payer: BLUE CROSS/BLUE SHIELD | Attending: Family Medicine | Admitting: Family Medicine

## 2018-09-12 ENCOUNTER — Other Ambulatory Visit: Payer: Self-pay

## 2018-09-12 ENCOUNTER — Encounter (HOSPITAL_COMMUNITY): Payer: Self-pay | Admitting: Emergency Medicine

## 2018-09-12 DIAGNOSIS — R6889 Other general symptoms and signs: Secondary | ICD-10-CM

## 2018-09-12 MED ORDER — CETIRIZINE-PSEUDOEPHEDRINE ER 5-120 MG PO TB12: 1.0000 | ORAL_TABLET | Freq: Every day | ORAL | 0 refills | Status: DC

## 2018-09-12 MED ORDER — BENZONATATE 100 MG PO CAPS: 100.0000 mg | ORAL_CAPSULE | Freq: Three times a day (TID) | ORAL | 0 refills | Status: DC

## 2018-09-12 MED ORDER — FLUTICASONE PROPIONATE 50 MCG/ACT NA SUSP: 2.0000 | Freq: Every day | NASAL | 0 refills | Status: AC

## 2018-09-12 NOTE — ED Provider Notes (Signed)
[facility] CARE CENTER   [id number] 09/12/18 Arrival Time: 1322   CC: Flu symptoms  SUBJECTIVE: History from: patient.  Katelyn Arnold is a 43 y.o. female hx significant for depression, anxiety, hypothyroid, IBS, HS, GERD, HA, and chronic bronchitis, who presents with onset of body aches, fever, tmax of 101.7 last night, chills, nasal congestion, and intermittent cough x 4 days.  Denies sick exposure to COVID, flu or strep, or exposure to quarantined individuals.  Denies recent travel.  Has tried OTC medications with relief.  Denies previous symptoms in the past.   Denies chills, sinus pain, rhinorrhea, sore throat, SOB, wheezing, chest pain, nausea, changes in bowel or bladder habits.    Received flu shot this year: no.  ROS: As per HPI.  Past Medical History:  Diagnosis Date  . Anxiety   . Chronic bronchitis (HCC)   . Depression   . GERD (gastroesophageal reflux disease)   . Headache    "monthly" (09/19/2017)  . Hidradenitis suppurativa   . Hypothyroidism   . Irritable bowel syndrome (IBS)    Past Surgical History:  Procedure Laterality Date  . ABDOMINAL HYSTERECTOMY  2015   LAPAROSCOPIC HYSTERECTOMY  . DIAPHRAGMATIC HERNIA REPAIR  09/19/2017   VIDEO BRONCHOSCOPY; VATS  . DILATION AND CURETTAGE OF UTERUS    . HERNIA REPAIR    . REPAIR OF ESOPHAGUS Right 09/19/2017   Procedure: REPAIR OF DIAPHRAGMATIC HERNIA;  Surgeon: Delight Ovens, MD;  Location: [facility] OR;  Service: Thoracic;  Laterality: Right;  Marland Kitchen VIDEO ASSISTED THORACOSCOPY Right 09/19/2017   Procedure: VIDEO ASSISTED THORACOSCOPY;  Surgeon: Delight Ovens, MD;  Location: [facility] OR;  Service: Thoracic;  Laterality: Right;  Marland Kitchen VIDEO BRONCHOSCOPY N/A 09/19/2017   Procedure: VIDEO BRONCHOSCOPY;  Surgeon: Delight Ovens, MD;  Location: [facility] OR;  Service: Thoracic;  Laterality: N/A;   Allergies  Allergen Reactions  . Compazine [Prochlorperazine Edisylate] Anaphylaxis   No current facility-administered medications on  file prior to encounter.    Current Outpatient Medications on File Prior to Encounter  Medication Sig Dispense Refill  . Adalimumab (HUMIRA [place]) Inject into the skin.    . NON FORMULARY Depression and anxiety medicine    . omeprazole (PRILOSEC) 20 MG capsule Take 20 mg by mouth daily before breakfast.  2  . venlafaxine (EFFEXOR) 75 MG tablet Take 75 mg by mouth 2 (two) times daily.    . hydrOXYzine (ATARAX/VISTARIL) 10 MG tablet Take 10 mg by mouth 3 (three) times daily as needed for anxiety.     . polyethylene glycol (MIRALAX / GLYCOLAX) packet Take 17 g by mouth every other day.     Social History   Socioeconomic History  . Marital status: Divorced    Spouse name: Not on file  . Number of children: Not on file  . Years of education: Not on file  . Highest education level: Not on file  Occupational History  . Not on file  Social Needs  . Financial resource strain: Not on file  . Food insecurity:    Worry: Not on file    Inability: Not on file  . Transportation needs:    Medical: Not on file    Non-medical: Not on file  Tobacco Use  . Smoking status: Never Smoker  . Smokeless tobacco: Never Used  Substance and Sexual Activity  . Alcohol use: Yes    Alcohol/week: 2.0 standard drinks    Types: 2 Glasses of wine per week  . Drug use: Never  . Sexual  activity: Yes  Lifestyle  . Physical activity:    Days per week: Not on file    Minutes per session: Not on file  . Stress: Not on file  Relationships  . Social connections:    Talks on phone: Not on file    Gets together: Not on file    Attends religious service: Not on file    Active member of club or organization: Not on file    Attends meetings of clubs or organizations: Not on file    Relationship status: Not on file  . Intimate partner violence:    Fear of current or ex partner: Not on file    Emotionally abused: Not on file    Physically abused: Not on file    Forced sexual activity: Not on file  Other Topics  Concern  . Not on file  Social History Narrative  . Not on file   Family History  Problem Relation Age of Onset  . Cirrhosis Mother   . Ulcerative colitis Mother   . Colon cancer Maternal Grandfather     OBJECTIVE:  Vitals:   09/12/18 1353  BP: 122/80  Pulse: 90  Resp: 18  Temp: 99.6 F (37.6 C)  TempSrc: Oral  SpO2: 99%     General appearance: alert; appears mildly fatigued, but nontoxic; speaking in full sentences and tolerating own secretions HEENT: NCAT; Ears: EACs clear, TMs pearly gray; Eyes: PERRL.  EOM grossly intact. Nose: nares patent without rhinorrhea, Throat: oropharynx clear, tonsils non erythematous or enlarged, uvula midline  Neck: supple without LAD Lungs: unlabored respirations, symmetrical air entry; cough: mild; no respiratory distress; CTAB Heart: regular rate and rhythm.  Radial pulses 2+ symmetrical bilaterally Skin: warm and dry Psychological: alert and cooperative; normal mood and affect  ASSESSMENT & PLAN:  1. Flu-like symptoms     Meds ordered this encounter  Medications  . cetirizine-pseudoephedrine (ZYRTEC-D) 5-120 MG tablet    Sig: Take 1 tablet by mouth daily.    Dispense:  30 tablet    Refill:  0    Order Specific Question:   Supervising Provider    Answer:   Eustace Moore [[id number]]  . fluticasone (FLONASE) 50 MCG/ACT nasal spray    Sig: Place 2 sprays into both nostrils daily.    Dispense:  16 g    Refill:  0    Order Specific Question:   Supervising Provider    Answer:   Eustace Moore [[id number]]  . benzonatate (TESSALON) 100 MG capsule    Sig: Take 1 capsule (100 mg total) by mouth every 8 (eight) hours.    Dispense:  21 capsule    Refill:  0    Order Specific Question:   Supervising Provider    Answer:   Eustace Moore [[id number]]   You were not tested for COVID today You should remain isolated in your home for 7 days from symptom onset AND/OR greater than 72 hours after symptoms resolution (absence of fever  without the use of fever-reducing medication and improvement in respiratory symptoms), whichever is longer Get plenty of rest and push fluids Zyrtec and flonase prescribed, use as needed for congestion and/ or runny nose Tessalon perles as needed for cough Take OTC tylenol as needed for fever, body aches, and/or chills Call or go to the ED if you have any new or worsening symptoms such as fever, worsening cough, shortness of breath, chest tightness, chest pain, turning blue, changes in mental status, etc..Marland Kitchen  Reviewed expectations re: course of current medical issues. Questions answered. Outlined signs and symptoms indicating need for more acute intervention. Patient verbalized understanding. After Visit Summary given.         Rennis Harding, PA-C 09/12/18 1528

## 2018-09-12 NOTE — Discharge Instructions (Addendum)
You were not tested for COVID today You should remain isolated in your home for 7 days from symptom onset AND/OR greater than 72 hours after symptoms resolution (absence of fever without the use of fever-reducing medication and improvement in respiratory symptoms), whichever is longer Get plenty of rest and push fluids Zyrtec and flonase prescribed, use as needed for congestion and/ or runny nose Tessalon perles as needed for cough Take OTC tylenol as needed for fever, body aches, and/or chills Call or go to the ED if you have any new or worsening symptoms such as fever, worsening cough, shortness of breath, chest tightness, chest pain, turning blue, changes in mental status, etc..Marland Kitchen

## 2018-09-12 NOTE — ED Triage Notes (Signed)
Body aches fever, headache, chills, intermittent cough.  Onset on Sunday of symptoms.  Reports fever 101.7-last night

## 2018-10-18 IMAGING — CR DG CHEST 2V
2 series · 2 of 2 positions shown · non-contrast
Comparison: Body CT 08/15/2017

CLINICAL DATA: Preoperative exam.

EXAM:
CHEST - 2 VIEW

[w chest pa]
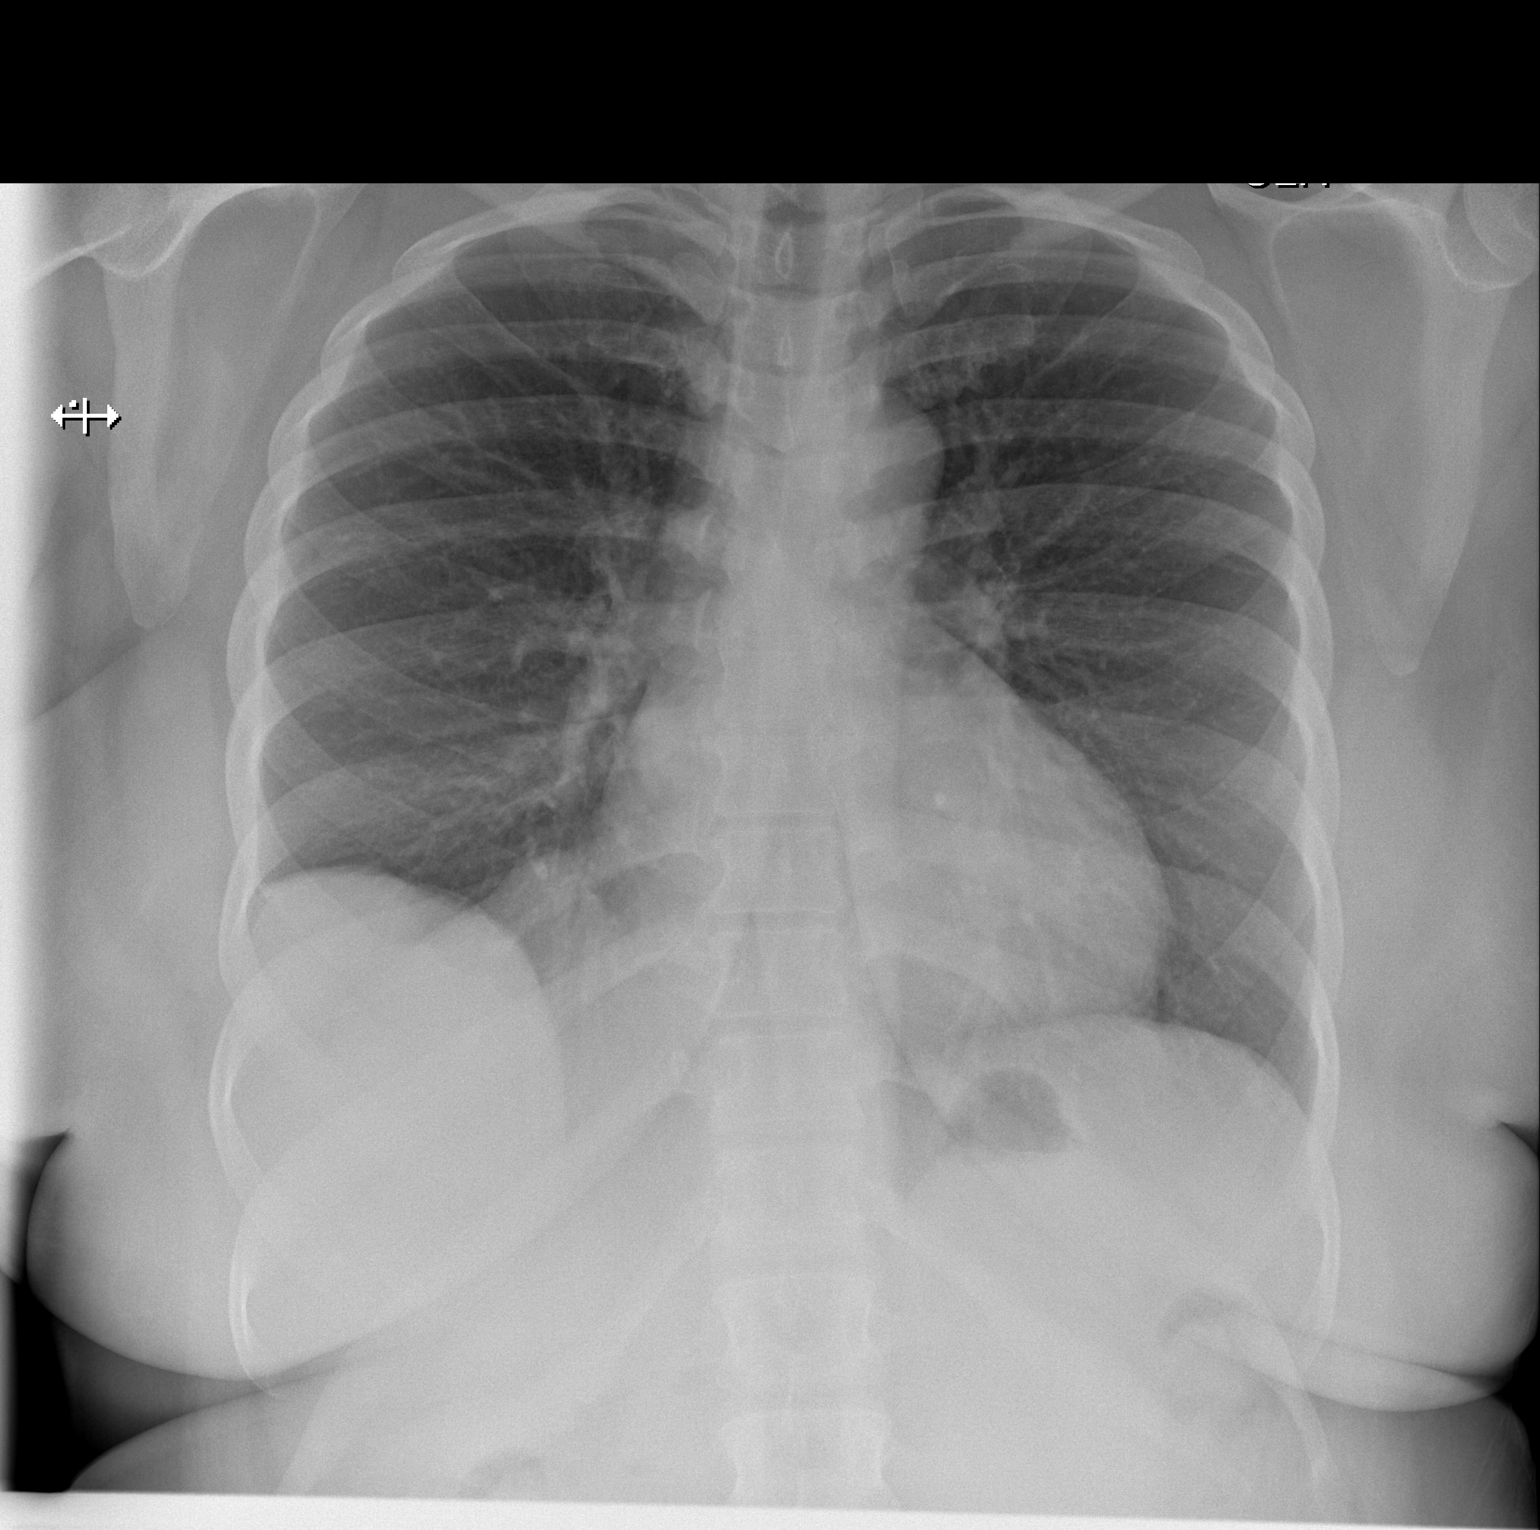

[w chest lat]
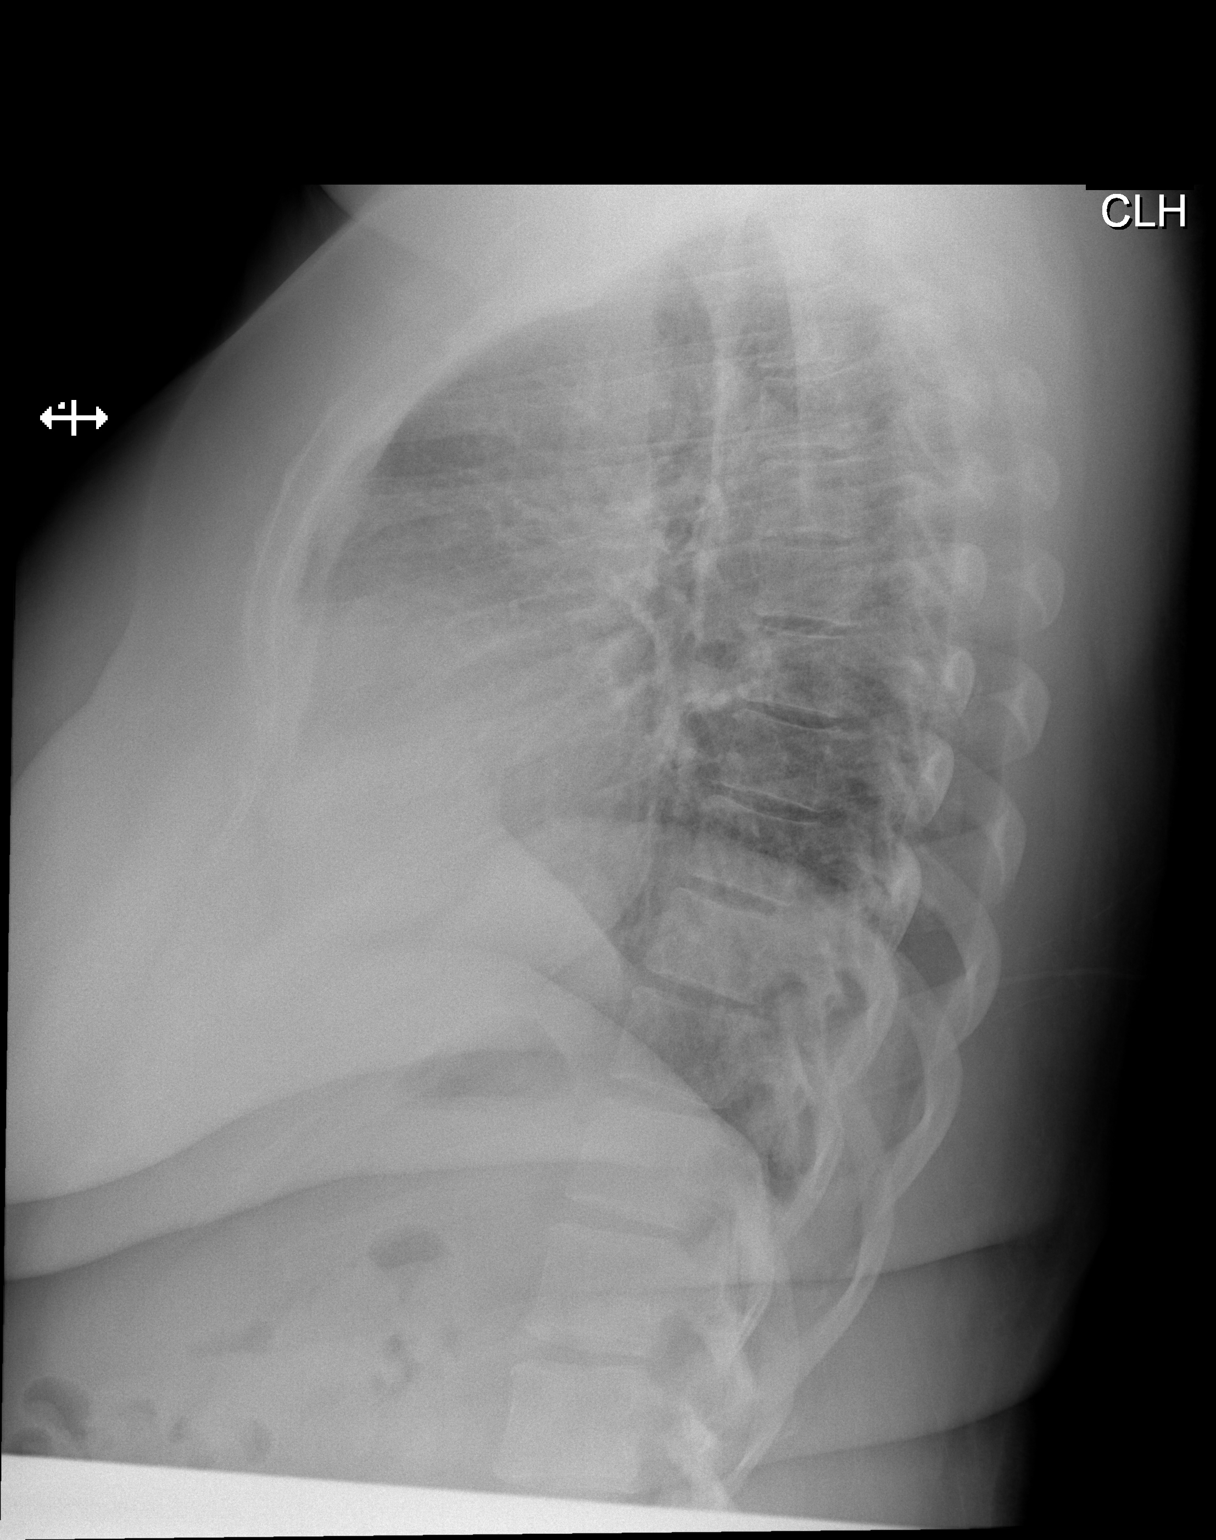

[2 of 2 positions shown; findings below may reference images not displayed]

FINDINGS: Cardiomediastinal silhouette is normal. Mediastinal contours appear
intact. Stable right posterior diaphragmatic hernia with elevation
of the right hemidiaphragm.

There is no evidence of focal airspace consolidation, pleural
effusion or pneumothorax.

Osseous structures are without acute abnormality. Soft tissues are
grossly normal.
IMPRESSION: No active cardiopulmonary disease.

Stable right posterior diaphragmatic hernia.

## 2019-01-04 ENCOUNTER — Emergency Department (HOSPITAL_COMMUNITY)
Admission: EM | Admit: 2019-01-04 | Discharge: 2019-01-04 | Disposition: A | Discharge status: Home / Self Care | Payer: BC Managed Care – PPO | Attending: Emergency Medicine | Admitting: Emergency Medicine

## 2019-01-04 ENCOUNTER — Emergency Department (HOSPITAL_COMMUNITY): Payer: BC Managed Care – PPO

## 2019-01-04 ENCOUNTER — Other Ambulatory Visit: Payer: Self-pay

## 2019-01-04 ENCOUNTER — Encounter (HOSPITAL_COMMUNITY): Payer: Self-pay | Admitting: Emergency Medicine

## 2019-01-04 DIAGNOSIS — N83202 Unspecified ovarian cyst, left side: Secondary | ICD-10-CM | POA: Insufficient documentation

## 2019-01-04 DIAGNOSIS — N838 Other noninflammatory disorders of ovary, fallopian tube and broad ligament: Secondary | ICD-10-CM

## 2019-01-04 DIAGNOSIS — N83299 Other ovarian cyst, unspecified side: Secondary | ICD-10-CM

## 2019-01-04 DIAGNOSIS — R1032 Left lower quadrant pain: Secondary | ICD-10-CM | POA: Diagnosis present

## 2019-01-04 DIAGNOSIS — Z79899 Other long term (current) drug therapy: Secondary | ICD-10-CM | POA: Diagnosis not present

## 2019-01-04 DIAGNOSIS — E039 Hypothyroidism, unspecified: Secondary | ICD-10-CM | POA: Insufficient documentation

## 2019-01-04 LAB — COMPREHENSIVE METABOLIC PANEL
ALT: 13 U/L (ref 0–44)
AST: 20 U/L (ref 15–41)
Albumin: 3.7 g/dL (ref 3.5–5.0)
Alkaline Phosphatase: 49 U/L (ref 38–126)
Anion gap: 9 (ref 5–15)
BUN: 8 mg/dL (ref 6–20)
CO2: 21 mmol/L — ABNORMAL LOW (ref 22–32)
Calcium: 9.3 mg/dL (ref 8.9–10.3)
Chloride: 106 mmol/L (ref 98–111)
Creatinine, Ser: 0.83 mg/dL (ref 0.44–1.00)
GFR calc Af Amer: >60 mL/min (ref >60)
GFR calc non Af Amer: >60 mL/min (ref >60)
Glucose, Bld: 95 mg/dL (ref 70–99)
Potassium: 3.7 mmol/L (ref 3.5–5.1)
Sodium: 136 mmol/L (ref 135–145)
Total Bilirubin: 0.6 mg/dL (ref 0.3–1.2)
Total Protein: 8.4 g/dL — ABNORMAL HIGH (ref 6.5–8.1)

## 2019-01-04 LAB — CBC WITH DIFFERENTIAL/PLATELET
Abs Immature Granulocytes: 0.03 10*3/uL (ref 0.00–0.07)
Basophils Absolute: 0 10*3/uL (ref 0.0–0.1)
Basophils Relative: 0 %
Eosinophils Absolute: 0.1 10*3/uL (ref 0.0–0.5)
Eosinophils Relative: 1 %
HCT: 39.9 % (ref 36.0–46.0)
Hemoglobin: 13 g/dL (ref 12.0–15.0)
Immature Granulocytes: 0 %
Lymphocytes Relative: 16 %
Lymphs Abs: 2.1 10*3/uL (ref 0.7–4.0)
MCH: 30.2 pg (ref 26.0–34.0)
MCHC: 32.6 g/dL (ref 30.0–36.0)
MCV: 92.6 fL (ref 80.0–100.0)
Monocytes Absolute: 0.8 10*3/uL (ref 0.1–1.0)
Monocytes Relative: 6 %
Neutro Abs: 9.9 10*3/uL — ABNORMAL HIGH (ref 1.7–7.7)
Neutrophils Relative %: 77 %
Platelets: 312 10*3/uL (ref 150–400)
RBC: 4.31 MIL/uL (ref 3.87–5.11)
RDW: 13.5 % (ref 11.5–15.5)
WBC: 12.9 10*3/uL — ABNORMAL HIGH (ref 4.0–10.5)
nRBC: 0 % (ref 0.0–0.2)

## 2019-01-04 MED ORDER — POLYETHYLENE GLYCOL 3350 17 GM/SCOOP PO POWD: 17.0000 g | Freq: Every day | ORAL | 0 refills | Status: AC

## 2019-01-04 MED ORDER — IOHEXOL 300 MG/ML  SOLN
100.0000 mL | Freq: Once | INTRAMUSCULAR | Status: AC | PRN
  Administered 2019-01-04: 100 mL via INTRAVENOUS

## 2019-01-04 MED ORDER — HYDROCODONE-ACETAMINOPHEN 5-325 MG PO TABS: 2.0000 | ORAL_TABLET | ORAL | 0 refills | Status: AC | PRN

## 2019-01-04 MED ORDER — DOCUSATE SODIUM 100 MG PO CAPS: 100.0000 mg | ORAL_CAPSULE | Freq: Two times a day (BID) | ORAL | 0 refills | Status: DC | PRN

## 2019-01-04 MED ORDER — MORPHINE SULFATE (PF) 4 MG/ML IV SOLN
4.0000 mg | Freq: Once | INTRAVENOUS | Status: AC
Start: 2019-01-04 — End: 2019-01-04
  Administered 2019-01-04: 12:00:00 4 mg via INTRAVENOUS
  Filled 2019-01-04: qty 1

## 2019-01-04 NOTE — ED Triage Notes (Signed)
Pt. Stated, Donnald Garre had left side stomach pain with some Nausea/and constipation for 10 days.. Last normal bowel movement yesterday but not normal

## 2019-01-04 NOTE — Discharge Instructions (Addendum)
You should receive a phone call from the OB/GYN office to set up an appointment by Thursday.  If you do not hear from them, call the office. Use Tylenol and ibuprofen as needed for mild to moderate pain.  Use Norco as needed for severe breakthrough pain.  Have caution, this may make you tired of IV.  Do not drive or operate heavy machinery while taking this medicine. Take MiraLAX, 1 scoop in 8 ounces of water, daily to help with constipation. Use Colace 2 times a day as needed for constipation. Make sure you are staying well-hydrated water. Return to the emergency room with any new, worsening, concerning symptoms.

## 2019-01-04 NOTE — ED Notes (Signed)
Patient verbalizes understanding of discharge instructions. Opportunity for questioning and answers were provided. Armband removed by staff, pt discharged from ED.  

## 2019-01-04 NOTE — ED Provider Notes (Signed)
[facility] EMERGENCY DEPARTMENT Provider Note   CSN: [id number] Arrival date & time: 01/04/19  1143    History   Chief Complaint Chief Complaint  Patient presents with   Abdominal Pain    left side   Nausea   Constipation    HPI Katelyn Arnold is a 43 y.o. female presenting for evaluation of abdominal pain.  Patient states the past 10 days, she has been having abdominal pain.  It is in her left low back and radiates to her left lower quadrant.  She reports associated nausea without vomiting.  She also reports she has had difficulty with bowel movements for the past 10 days.  She states she has to strain a lot, and only small hard pellets are coming out.  She was able to have a bowel movement yesterday, but it was the same.  She denies blood in her stool.  She denies fevers, chills, chest pain, shortness of breath, cough, epigastric pain, or urinary symptoms.  Patient states she was seen by her PCPs office today, instructed to come to the ER for further evaluation.  She reports a history of a hysterectomy which was performed laparoscopically.  Last year, she had a new assisted bronchoscopy and thoracoscopy for a hernia that was coming into the chest cavity with herniation of the liver.  She reports no issues since.  Patient states she takes medication for her mental health daily, no other medications daily.  She has been taking Tylenol and ibuprofen without improvement of her pain.  She has not taken anything else.  Nothing makes her pain better or worse.  It is constant. No h/o kidney stones     HPI  Past Medical History:  Diagnosis Date   Anxiety    Chronic bronchitis (HCC)    Depression    GERD (gastroesophageal reflux disease)    Headache    "monthly" (09/19/2017)   Hidradenitis suppurativa    Hypothyroidism    Irritable bowel syndrome (IBS)     Patient Active Problem List   Diagnosis Date Noted   Diaphragmatic hernia 08/31/2017    Past  Surgical History:  Procedure Laterality Date   ABDOMINAL HYSTERECTOMY  2015   LAPAROSCOPIC HYSTERECTOMY   DIAPHRAGMATIC HERNIA REPAIR  09/19/2017   VIDEO BRONCHOSCOPY; VATS   DILATION AND CURETTAGE OF UTERUS     HERNIA REPAIR     REPAIR OF ESOPHAGUS Right 09/19/2017   Procedure: REPAIR OF DIAPHRAGMATIC HERNIA;  Surgeon: Delight Ovens, MD;  Location: [facility] OR;  Service: Thoracic;  Laterality: Right;   VIDEO ASSISTED THORACOSCOPY Right 09/19/2017   Procedure: VIDEO ASSISTED THORACOSCOPY;  Surgeon: Delight Ovens, MD;  Location: [facility] OR;  Service: Thoracic;  Laterality: Right;   VIDEO BRONCHOSCOPY N/A 09/19/2017   Procedure: VIDEO BRONCHOSCOPY;  Surgeon: Delight Ovens, MD;  Location: [facility] OR;  Service: Thoracic;  Laterality: N/A;     OB History   No obstetric history on file.      Home Medications    Prior to Admission medications   Medication Sig Start Date End Date Taking? Authorizing Provider  Adalimumab (HUMIRA [place]) Inject into the skin.    [provider]  benzonatate (TESSALON) 100 MG capsule Take 1 capsule (100 mg total) by mouth every 8 (eight) hours. 09/12/18   Wurst, [place], PA-C  cetirizine-pseudoephedrine (ZYRTEC-D) 5-120 MG tablet Take 1 tablet by mouth daily. 09/12/18   Wurst, [place], PA-C  docusate sodium (COLACE) 100 MG capsule Take 1 capsule (100 mg  total) by mouth every 12 (twelve) hours as needed for mild constipation. 01/04/19   Breigh Annett, PA-C  fluticasone (FLONASE) 50 MCG/ACT nasal spray Place 2 sprays into both nostrils daily. 09/12/18   Wurst, [place], PA-C  HYDROcodone-acetaminophen (NORCO/VICODIN) 5-325 MG tablet Take 2 tablets by mouth every 4 (four) hours as needed. 01/04/19   Angelik Walls, PA-C  hydrOXYzine (ATARAX/VISTARIL) 10 MG tablet Take 10 mg by mouth 3 (three) times daily as needed for anxiety.     [provider]  NON FORMULARY Depression and anxiety medicine    [provider]  omeprazole (PRILOSEC) 20  MG capsule Take 20 mg by mouth daily before breakfast. 08/29/17   [provider]  polyethylene glycol powder (MIRALAX) 17 GM/SCOOP powder Take 17 g by mouth daily. Mix 1 scoop in 8 oz water daily 01/04/19   Payson Crumby, PA-C  venlafaxine (EFFEXOR) 75 MG tablet Take 75 mg by mouth 2 (two) times daily.    [provider]    Family History Family History  Problem Relation Age of Onset   Cirrhosis Mother    Ulcerative colitis Mother    Colon cancer Maternal Grandfather     Social History Social History   Tobacco Use   Smoking status: Never Smoker   Smokeless tobacco: Never Used  Substance Use Topics   Alcohol use: Yes    Alcohol/week: 2.0 standard drinks    Types: 2 Glasses of wine per week   Drug use: Never     Allergies   Compazine [prochlorperazine edisylate]   Review of Systems Review of Systems  Gastrointestinal: Positive for abdominal pain, constipation and nausea.  All other systems reviewed and are negative.    Physical Exam Updated Vital Signs BP (!) 151/99    Pulse 88    Temp 98 F (36.7 C) (Oral)    Resp 16    Ht 5\' 2"  (1.575 m)    Wt 97.1 kg    LMP  (LMP Unknown)    SpO2 100%    BMI 39.14 kg/m   Physical Exam Vitals signs and nursing note reviewed.  Constitutional:      General: She is not in acute distress.    Appearance: She is well-developed.     Comments: Appears nontoxic  HENT:     Head: Normocephalic and atraumatic.  Eyes:     Conjunctiva/sclera: Conjunctivae normal.     Pupils: Pupils are equal, round, and reactive to light.  Neck:     Musculoskeletal: Normal range of motion and neck supple.  Cardiovascular:     Rate and Rhythm: Normal rate and regular rhythm.  Pulmonary:     Effort: Pulmonary effort is normal. No respiratory distress.     Breath sounds: Normal breath sounds. No wheezing.  Abdominal:     General: There is no distension.     Palpations: Abdomen is soft.     Tenderness: There is abdominal  tenderness in the suprapubic area and left lower quadrant. There is no right CVA tenderness or left CVA tenderness.     Comments: Tenderness palpation of suprapubic and left lower quadrant abdomen.  Tenderness palpation of left low back.  No CVA tenderness.  No rigidity or distention.  Negative rebound.  Musculoskeletal: Normal range of motion.  Skin:    General: Skin is warm and dry.     Capillary Refill: Capillary refill takes less than 2 seconds.  Neurological:     Mental Status: She is alert and oriented to person,  place, and time.      ED Treatments / Results  Labs (all labs ordered are listed, but only abnormal results are displayed) Labs Reviewed  CBC WITH DIFFERENTIAL/PLATELET - Abnormal; Notable for the following components:      Result Value   WBC 12.9 (*)    Neutro Abs 9.9 (*)    All other components within normal limits  COMPREHENSIVE METABOLIC PANEL - Abnormal; Notable for the following components:   CO2 21 (*)    Total Protein 8.4 (*)    All other components within normal limits    EKG None  Radiology [place] Transvaginal Non-ob  Result Date: 01/04/2019 CLINICAL DATA:  Pelvic pain for 10 days.  Previous hysterectomy. EXAM: TRANSABDOMINAL AND TRANSVAGINAL ULTRASOUND OF PELVIS DOPPLER ULTRASOUND OF OVARIES TECHNIQUE: Both transabdominal and transvaginal ultrasound examinations of the pelvis were performed. Transabdominal technique was performed for global imaging of the pelvis including uterus, ovaries, adnexal regions, and pelvic cul-de-sac. It was necessary to proceed with endovaginal exam following the transabdominal exam to visualize the adnexal regions and ovaries. Color and duplex Doppler ultrasound was utilized to evaluate blood flow to the ovaries. COMPARISON:  CT of the abdomen and pelvis on 01/04/2019 FINDINGS: Uterus Measurements: Surgically absent.  Vaginal cuff is unremarkable. Endometrium Thickness: Surgically absent. Right ovary Measurements: RIGHT ovary is not  identified. Left ovary Measurements: Within the LEFT adnexal region there is a complex solid and cystic mass. A cystic mass measures 8.9 x 7.5 x 6.0 centimeters and contains a solid and cystic thick walled mass measuring 4.1 x 4.2 x 4.1 centimeters. There is significant vascularity in the wall and septations of the mass. Normal venous waveforms are present, without evidence for complete torsion. Other findings No abnormal free fluid. IMPRESSION: 8.9 centimeter complex solid and cystic mass in the LEFT adnexal region suspicious for neoplasm. Recommend tissue diagnosis. Consider MRI for further characterization prior to surgery. No free pelvic fluid. Status post hysterectomy. RIGHT ovary is not imaged and may be surgically absent. Electronically Signed   By: Norva PavlovElizabeth  Brown M.D.   On: 01/04/2019 16:01   [place] Pelvis Complete  Result Date: 01/04/2019 CLINICAL DATA:  Pelvic pain for 10 days.  Previous hysterectomy. EXAM: TRANSABDOMINAL AND TRANSVAGINAL ULTRASOUND OF PELVIS DOPPLER ULTRASOUND OF OVARIES TECHNIQUE: Both transabdominal and transvaginal ultrasound examinations of the pelvis were performed. Transabdominal technique was performed for global imaging of the pelvis including uterus, ovaries, adnexal regions, and pelvic cul-de-sac. It was necessary to proceed with endovaginal exam following the transabdominal exam to visualize the adnexal regions and ovaries. Color and duplex Doppler ultrasound was utilized to evaluate blood flow to the ovaries. COMPARISON:  CT of the abdomen and pelvis on 01/04/2019 FINDINGS: Uterus Measurements: Surgically absent.  Vaginal cuff is unremarkable. Endometrium Thickness: Surgically absent. Right ovary Measurements: RIGHT ovary is not identified. Left ovary Measurements: Within the LEFT adnexal region there is a complex solid and cystic mass. A cystic mass measures 8.9 x 7.5 x 6.0 centimeters and contains a solid and cystic thick walled mass measuring 4.1 x 4.2 x 4.1 centimeters.  There is significant vascularity in the wall and septations of the mass. Normal venous waveforms are present, without evidence for complete torsion. Other findings No abnormal free fluid. IMPRESSION: 8.9 centimeter complex solid and cystic mass in the LEFT adnexal region suspicious for neoplasm. Recommend tissue diagnosis. Consider MRI for further characterization prior to surgery. No free pelvic fluid. Status post hysterectomy. RIGHT ovary is not imaged and may be surgically absent. Electronically  Signed   By: Norva PavlovElizabeth  Brown M.D.   On: 01/04/2019 16:01   Ct Abdomen Pelvis W Contrast  Result Date: 01/04/2019 CLINICAL DATA:  43 year old female with acute LEFT abdominal and pelvic pain with vomiting for 10 days. EXAM: CT ABDOMEN AND PELVIS WITH CONTRAST TECHNIQUE: Multidetector CT imaging of the abdomen and pelvis was performed using the standard protocol following bolus administration of intravenous contrast. CONTRAST:  100mL OMNIPAQUE IOHEXOL 300 MG/ML  SOLN COMPARISON:  08/09/2017 CT FINDINGS: Lower chest: No acute abnormality. Hepatobiliary: The liver and gallbladder are unremarkable. No biliary dilatation. Pancreas: Unremarkable Spleen: Unremarkable Adrenals/Urinary Tract: The kidneys, adrenal glands and bladder are unremarkable. Stomach/Bowel: Stomach is within normal limits. Appendix appears normal. No evidence of bowel wall thickening, distention, or inflammatory changes. Vascular/Lymphatic: No significant vascular findings are present. No enlarged abdominal or pelvic lymph nodes. Reproductive: A 9 x 6 x 8 cm complex cystic LEFT ovarian mass is now noted. Mild inflammation/stranding within the central pelvic fat noted. The patient is status post hysterectomy. Other: No ascites or pneumoperitoneum. Musculoskeletal: No acute or suspicious bony abnormality. IMPRESSION: 1. 9 x 6 x 8 cm complex cystic LEFT ovarian mass with mild inflammation in the central pelvis. Pelvic ultrasound is recommended for further  evaluation. 2. No other acute or significant abnormalities. Electronically Signed   By: Harmon PierJeffrey  Hu M.D.   On: 01/04/2019 14:20   [place] Pelvic Doppler (torsion R/o Or Mass Arterial Flow)  Result Date: 01/04/2019 CLINICAL DATA:  Pelvic pain for 10 days.  Previous hysterectomy. EXAM: TRANSABDOMINAL AND TRANSVAGINAL ULTRASOUND OF PELVIS DOPPLER ULTRASOUND OF OVARIES TECHNIQUE: Both transabdominal and transvaginal ultrasound examinations of the pelvis were performed. Transabdominal technique was performed for global imaging of the pelvis including uterus, ovaries, adnexal regions, and pelvic cul-de-sac. It was necessary to proceed with endovaginal exam following the transabdominal exam to visualize the adnexal regions and ovaries. Color and duplex Doppler ultrasound was utilized to evaluate blood flow to the ovaries. COMPARISON:  CT of the abdomen and pelvis on 01/04/2019 FINDINGS: Uterus Measurements: Surgically absent.  Vaginal cuff is unremarkable. Endometrium Thickness: Surgically absent. Right ovary Measurements: RIGHT ovary is not identified. Left ovary Measurements: Within the LEFT adnexal region there is a complex solid and cystic mass. A cystic mass measures 8.9 x 7.5 x 6.0 centimeters and contains a solid and cystic thick walled mass measuring 4.1 x 4.2 x 4.1 centimeters. There is significant vascularity in the wall and septations of the mass. Normal venous waveforms are present, without evidence for complete torsion. Other findings No abnormal free fluid. IMPRESSION: 8.9 centimeter complex solid and cystic mass in the LEFT adnexal region suspicious for neoplasm. Recommend tissue diagnosis. Consider MRI for further characterization prior to surgery. No free pelvic fluid. Status post hysterectomy. RIGHT ovary is not imaged and may be surgically absent. Electronically Signed   By: Norva PavlovElizabeth  Brown M.D.   On: 01/04/2019 16:01    Procedures Procedures (including critical care time)  Medications Ordered in  ED Medications  morphine 4 MG/ML injection 4 mg (4 mg Intravenous Given 01/04/19 1222)  iohexol (OMNIPAQUE) 300 MG/ML solution 100 mL (100 mLs Intravenous Contrast Given 01/04/19 1354)     Initial Impression / Assessment and Plan / ED Course  I have reviewed the triage vital signs and the nursing notes.  Pertinent labs & imaging results that were available during my care of the patient were reviewed by me and considered in my medical decision making (see chart for details).  Pt presenting for evaluation of abdominal pain.  Physical exam shows patient with abdominal tenderness, but she appears nontoxic.  Consider diverticulitis versus kidney stone versus obstruction versus constipation versus colitis.  Lower suspicion for appendicitis, as pain is on the left side.  Will obtain labs and CT for further evaluation.  Morphine for pain.  Labs show elevated white count of 12.9.  Otherwise reassuring.  On reassessment, patient reports pain is improved.  CT shows large lesion of the left ovary, likely cyst. This is likely causing pt's constipation. Recommending pelvic ultrasound for further evaluation.  Patient states when she had a hysterectomy her right ovary was removed, but her left remained.  Pelvic ultrasound shows 9 x 7 and half by 6 cm cyst of the left ovary, part of which is solid and part of which is fluid-filled.  Concerning for neoplasm.  Will consult with patient's OB/GYN practice for further management.  Discussed with Dr. Richardson Doppole from [facility] who is on-call today.  Recommended outpatient management and symptomatic medications at home.  Will have patient follow-up in the office in the next 5 days.  Discussed findings and plan with patient, who is agreeable.  Will discharge patient on Norco for pain control, MiraLAX and stool softener for constipation. Return to ER if sxs are unmanageable.  At this time, patient appears safe for discharge.  Return precautions given.  Patient states  she understands and agrees to plan.   Final Clinical Impressions(s) / ED Diagnoses   Final diagnoses:  Mass of left ovary    ED Discharge Orders         Ordered    HYDROcodone-acetaminophen (NORCO/VICODIN) 5-325 MG tablet  Every 4 hours PRN     01/04/19 1643    polyethylene glycol powder (MIRALAX) 17 GM/SCOOP powder  Daily     01/04/19 1643    docusate sodium (COLACE) 100 MG capsule  Every 12 hours PRN     01/04/19 1643           Kelcey Korus, PA-C 01/04/19 1954    Margarita Grizzleay, Danielle, MD 01/05/19 1536

## 2019-01-08 ENCOUNTER — Telehealth: Payer: Self-pay | Admitting: *Deleted

## 2019-01-08 NOTE — Telephone Encounter (Signed)
Called and spoke with the patient, scheduled a new patient appt for 8/10. Gave date/time and appt instructions

## 2019-01-20 ENCOUNTER — Inpatient Hospital Stay: Payer: BC Managed Care – PPO | Attending: Gynecologic Oncology | Admitting: Gynecologic Oncology

## 2019-01-20 ENCOUNTER — Encounter: Payer: Self-pay | Admitting: Gynecologic Oncology

## 2019-01-20 ENCOUNTER — Other Ambulatory Visit: Payer: Self-pay

## 2019-01-20 ENCOUNTER — Other Ambulatory Visit (HOSPITAL_COMMUNITY)
Admission: RE | Admit: 2019-01-20 | Discharge: 2019-01-20 | Disposition: A | Discharge status: Home / Self Care | Payer: BC Managed Care – PPO | Source: Ambulatory Visit | Attending: Gynecologic Oncology | Admitting: Gynecologic Oncology

## 2019-01-20 ENCOUNTER — Other Ambulatory Visit: Payer: Self-pay | Admitting: Gynecologic Oncology

## 2019-01-20 VITALS — BP 142/80 | HR 82 | Temp 99.1°F | Resp 18 | Ht 62.0 in | Wt 215.0 lb

## 2019-01-20 DIAGNOSIS — F329 Major depressive disorder, single episode, unspecified: Secondary | ICD-10-CM | POA: Insufficient documentation

## 2019-01-20 DIAGNOSIS — Z803 Family history of malignant neoplasm of breast: Secondary | ICD-10-CM | POA: Diagnosis not present

## 2019-01-20 DIAGNOSIS — N809 Endometriosis, unspecified: Secondary | ICD-10-CM

## 2019-01-20 DIAGNOSIS — Z79899 Other long term (current) drug therapy: Secondary | ICD-10-CM | POA: Diagnosis not present

## 2019-01-20 DIAGNOSIS — E669 Obesity, unspecified: Secondary | ICD-10-CM | POA: Diagnosis not present

## 2019-01-20 DIAGNOSIS — Z90710 Acquired absence of both cervix and uterus: Secondary | ICD-10-CM | POA: Insufficient documentation

## 2019-01-20 DIAGNOSIS — Z90721 Acquired absence of ovaries, unilateral: Secondary | ICD-10-CM | POA: Insufficient documentation

## 2019-01-20 DIAGNOSIS — N83292 Other ovarian cyst, left side: Secondary | ICD-10-CM | POA: Diagnosis present

## 2019-01-20 DIAGNOSIS — N83202 Unspecified ovarian cyst, left side: Secondary | ICD-10-CM | POA: Diagnosis not present

## 2019-01-20 DIAGNOSIS — Z800 Family history of malignant neoplasm of digestive organs: Secondary | ICD-10-CM | POA: Insufficient documentation

## 2019-01-20 DIAGNOSIS — K580 Irritable bowel syndrome with diarrhea: Secondary | ICD-10-CM | POA: Insufficient documentation

## 2019-01-20 DIAGNOSIS — L732 Hidradenitis suppurativa: Secondary | ICD-10-CM | POA: Diagnosis not present

## 2019-01-20 DIAGNOSIS — Z20828 Contact with and (suspected) exposure to other viral communicable diseases: Secondary | ICD-10-CM | POA: Insufficient documentation

## 2019-01-20 DIAGNOSIS — E039 Hypothyroidism, unspecified: Secondary | ICD-10-CM | POA: Diagnosis not present

## 2019-01-20 DIAGNOSIS — Z6839 Body mass index (BMI) 39.0-39.9, adult: Secondary | ICD-10-CM | POA: Insufficient documentation

## 2019-01-20 DIAGNOSIS — Z01812 Encounter for preprocedural laboratory examination: Secondary | ICD-10-CM | POA: Insufficient documentation

## 2019-01-20 LAB — SARS CORONAVIRUS 2 (TAT 6-24 HRS): SARS Coronavirus 2: NEGATIVE

## 2019-01-20 MED ORDER — IBUPROFEN 800 MG PO TABS: 800.0000 mg | ORAL_TABLET | Freq: Three times a day (TID) | ORAL | 0 refills | Status: AC | PRN

## 2019-01-20 MED ORDER — OXYCODONE HCL 5 MG PO TABS: 5.0000 mg | ORAL_TABLET | ORAL | 0 refills | Status: AC | PRN

## 2019-01-20 MED ORDER — SENNOSIDES-DOCUSATE SODIUM 8.6-50 MG PO TABS: 2.0000 | ORAL_TABLET | Freq: Every day | ORAL | 1 refills | Status: AC

## 2019-01-20 NOTE — Progress Notes (Signed)
Consult Note: Gyn-Onc  Consult was requested by Dr. Ozan for the evaluation of Katelyn Arnold 43 y.o. female  CC:  Chief Complaint  Patient presents with  . Complex cyst of left ovary    Assessment/Plan:  Katelyn Arnold  is a 43 y.o.  year old with a complex left ovarian cyst in the setting of normal tumor markers. 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 She has a history of endometriosis and is s/p hysterectomy with RSO for fibroids and endometriosis. She is obese with a BMI of 39kg/m2.  I discussed with Katelyn Arnold my recommendation of surgical excision of the left tube and ovary given how symptomatic it is. Together we reviewed the images from her CT scan. I discussed that surgery would be technically challenging due to her obesity, and due to the parent close relationships of this ovarian cyst with the bladder and rectum.  I explained that she is at higher risk for potential complication including damage to the bladder, ureters, or intestines. While the risk of complication overall is low, it is higher than a patient without her coexisting comorbidities and prior surgical history.  I offered her close follow-up as an alternative, however, this would not alleviate her symptoms that she is experiencing from the cyst.  I discussed that oophorectomy would be associated with surgical menopause.  Given her young premenopausal age, I would recommend postoperative estrogen replacement therapy.  I discussed that this has not been shown to be associated with worsening endometriosis, nor has it been associated with an increased risk for breast cancer when taken at age prior to natural menopause.  I explained that she is at a slightly higher risk for breast cancer compared to the baseline population due to her family history of a first-degree relative with breast cancer, however the patient's risk is not further elevated by her taking estrogen at a premenopausal age as  replacement for surgical menopause.  We discussed her Humira use, and I discussed this further with [facility].  While it is associated with an increased risk for infection, surgery is not contraindicated and holding Humira pre-operatively is not absolutely necessary.  I would however, recommend holding Humira for at least 2 weeks postoperatively while she heals.  We discussed the potential increased risk for infection with a history of using this agent.  She is enthusiastic about first available surgical date, which would be with my partner Dr. Wendy Brewster on January 23, 2019.  I will have her take a bowel prep preoperatively in case there is entry into the rectum during surgical excision to avoid peritoneal contamination by large volume stool.  She is scheduled for robotic assisted left salpingo-oophorectomy, possible staging.  She understands that frozen section will be performed, and based on findings, the appropriate and technically feasible staging procedures will follow if malignancy is identified or suspected.   HPI: Katelyn Arnold is a 43 year old P0 who is seen in consultation at the request of Dr Ozan for a left ovarian cystic mass.  The patient presented to the emergency department on January 05, 2019 with left-sided pelvic pain, nausea, constipation.  As part of her work-up she underwent a CT scan of the abdomen and pelvis.  This revealed grossly normal-appearing bowel with no wall thickening distention or inflammatory changes.  There was a 9 x 6 x 8 cm complex cystic left ovarian mass noted in the pelvis.  This appeared to be retroperitoneal to the sigmoid colon.  There   is mild inflammation and stranding within the central pelvic fat noted.  She is status post hysterectomy and RSO.  A follow-up transvaginal ultrasound scan was performed on the same day which revealed a surgically absent uterus and right ovary.  The left ovary was replaced by a complex solid and cystic mass  measuring 8.9 x 7.5 x 6 cm.  It contained solid and cystic thick-walled mass measuring 4.1 x 4.2 x 4.1 cm.  There is significant vascularity in the wall and septations of the mass.  There was no evidence for torsion.  Tumor markers were drawn including a Roma score which revealed a normal Ca1 25 of 23.6, a normal HD4 at 35.5, and a normal premenopausal Roma score of 0.35 (less than 1.14 is consistent with low likelihood of finding a malignancy at surgery).  The patient's medical history is significant for diagnosis of fibroid uterus and endometriosis for which she underwent a laparoscopic total hysterectomy and right salpingo-oophorectomy in [place] in 2013.  She reports this was an uncomplicated procedure.  She believes that the cervix was removed as part of the procedure.  She denies that they found significant adhesive disease at the time of surgery as far she is aware.  The patient also subsequently underwent a VATS procedure for a diaphragmatic hernia repair.  This was performed in 0160 and was uncomplicated.  She experiences depression, history of hypothyroidism, IBS with diarrhea, hidradenitis supra [address] [place] for which she takes Humira 40 mg subcutaneous weekly (she doses on Friday with her last documented dose on January 17, 2019).  She has been pregnant 1 time which resulted in elective termination.  The patient works as a Animal nutritionist at [employer].  The patient's mother died from breast cancer, and her maternal grandfather died from colon cancer.  Current Meds:  Outpatient Encounter Medications as of 01/20/2019  Medication Sig  . cetirizine-pseudoephedrine (ZYRTEC-D) 5-120 MG tablet Take 1 tablet by mouth daily.  Marland Kitchen docusate sodium (COLACE) 100 MG capsule Take 1 capsule (100 mg total) by mouth every 12 (twelve) hours as needed for mild constipation.  . fluticasone (FLONASE) 50 MCG/ACT nasal spray Place 2 sprays into both nostrils daily.  Marland Kitchen HUMIRA PEN 40 MG/0.4ML PNKT Inject 40  mg into the skin once a week.   Marland Kitchen HYDROcodone-acetaminophen (NORCO/VICODIN) 5-325 MG tablet Take 2 tablets by mouth every 4 (four) hours as needed.  . hydrOXYzine (ATARAX/VISTARIL) 10 MG tablet Take 10 mg by mouth 3 (three) times daily as needed for anxiety.   . NON FORMULARY Depression and anxiety medicine  . omeprazole (PRILOSEC) 20 MG capsule Take 20 mg by mouth daily before breakfast.  . polyethylene glycol powder (MIRALAX) 17 GM/SCOOP powder Take 17 g by mouth daily. Mix 1 scoop in 8 oz water daily  . venlafaxine (EFFEXOR) 75 MG tablet Take 75 mg by mouth 2 (two) times daily.  . [DISCONTINUED] Adalimumab (HUMIRA [place]) Inject into the skin.  . [DISCONTINUED] benzonatate (TESSALON) 100 MG capsule Take 1 capsule (100 mg total) by mouth every 8 (eight) hours.   No facility-administered encounter medications on file as of 01/20/2019.     Allergy:  Allergies  Allergen Reactions  . Compazine [Prochlorperazine Edisylate] Anaphylaxis    Social Hx:   Social History   Socioeconomic History  . Marital status: Divorced    Spouse name: Not on file  . Number of children: Not on file  . Years of education: Not on file  . Highest education level: Not on file  Occupational History  . Not on file  Social Needs  . Financial resource strain: Not on file  . Food insecurity    Worry: Not on file    Inability: Not on file  . Transportation needs    Medical: Not on file    Non-medical: Not on file  Tobacco Use  . Smoking status: Never Smoker  . Smokeless tobacco: Never Used  Substance and Sexual Activity  . Alcohol use: Yes    Alcohol/week: 2.0 standard drinks    Types: 2 Glasses of wine per week  . Drug use: Never  . Sexual activity: Yes  Lifestyle  . Physical activity    Days per week: Not on file    Minutes per session: Not on file  . Stress: Not on file  Relationships  . Social Musicianconnections    Talks on phone: Not on file    Gets together: Not on file    Attends religious service:  Not on file    Active member of club or organization: Not on file    Attends meetings of clubs or organizations: Not on file    Relationship status: Not on file  . Intimate partner violence    Fear of current or ex partner: Not on file    Emotionally abused: Not on file    Physically abused: Not on file    Forced sexual activity: Not on file  Other Topics Concern  . Not on file  Social History Narrative  . Not on file    Past Surgical Hx:  Past Surgical History:  Procedure Laterality Date  . ABDOMINAL HYSTERECTOMY  2015   LAPAROSCOPIC HYSTERECTOMY  . DIAPHRAGMATIC HERNIA REPAIR  09/19/2017   VIDEO BRONCHOSCOPY; VATS  . DILATION AND CURETTAGE OF UTERUS    . HERNIA REPAIR    . REPAIR OF ESOPHAGUS Right 09/19/2017   Procedure: REPAIR OF DIAPHRAGMATIC HERNIA;  Surgeon: Delight OvensGerhardt, Edward B, MD;  [facility];  Service: Thoracic;  Laterality: Right;  Marland Kitchen. VIDEO ASSISTED THORACOSCOPY Right 09/19/2017   Procedure: VIDEO ASSISTED THORACOSCOPY;  Surgeon: Delight OvensGerhardt, Edward B, MD;  [facility];  Service: Thoracic;  Laterality: Right;  Marland Kitchen. VIDEO BRONCHOSCOPY N/A 09/19/2017   Procedure: VIDEO BRONCHOSCOPY;  Surgeon: Delight OvensGerhardt, Edward B, MD;  [facility];  Service: Thoracic;  Laterality: N/A;    Past Medical Hx:  Past Medical History:  Diagnosis Date  . Anxiety   . Chronic bronchitis (HCC)   . Depression   . GERD (gastroesophageal reflux disease)   . Headache    "monthly" (09/19/2017)  . Hidradenitis suppurativa   . Hypothyroidism   . Irritable bowel syndrome (IBS)     Past Gynecological History:  See HPI No LMP recorded (lmp unknown). Patient has had a hysterectomy.  Family Hx:  Family History  Problem Relation Age of Onset  . Cirrhosis Mother   . Ulcerative colitis Mother   . Breast cancer Mother   . Colon cancer Maternal Grandfather     Review of Systems:  Constitutional  Feels well,    ENT Normal appearing ears and nares bilaterally Skin/Breast  No rash, sores,  jaundice, itching, dryness Cardiovascular  No chest pain, shortness of breath, or edema  Pulmonary  No cough or wheeze.  Gastro Intestinal  + left pelvic pain and constipation and nausea Genito Urinary  No frequency, urgency, dysuria, no bleeding Musculo Skeletal  No myalgia, arthralgia, joint swelling or pain  Neurologic  No weakness, numbness, change in gait,  Psychology  No depression, anxiety, insomnia.   Vitals:  Blood pressure (!) 142/80, pulse 82, temperature 99.1 F (37.3 C), resp. rate 18, height 5\' 2"  (1.575 m), weight 215 lb (97.5 kg), SpO2 100 %.  Physical Exam: WD in NAD Neck  Supple NROM, without any enlargements.  Lymph Node Survey No cervical supraclavicular or inguinal adenopathy Cardiovascular  Pulse normal rate, regularity and rhythm. S1 and S2 normal.  Lungs  Clear to auscultation bilateraly, without wheezes/crackles/rhonchi. Good air movement.  Skin  No rash/lesions/breakdown  Psychiatry  Alert and oriented to person, place, and time  Abdomen  Normoactive bowel sounds, abdomen soft, non-tender and obese without evidence of hernia.  Back No CVA tenderness Genito Urinary  Vulva/vagina: Normal external female genitalia.  No lesions. No discharge or bleeding.  Bladder/urethra:  No lesions or masses, well supported bladder  Vagina: normal  Cervix: palpably a ridge is present where cervix had been present - may be partial residual cervix from prior procedure versus cuff scar.  Uterus: surgically absent  Adnexa: fullness appreciated in pelvis, mobile, no nodularity Rectal  Good tone, no masses no cul de sac nodularity. Rectum does not feel adherent to mass on exam.  Extremities  No bilateral cyanosis, clubbing or edema.   Luisa DagoEmma C Mara Favero, MD  01/20/2019, 11:08 AM

## 2019-01-20 NOTE — Patient Instructions (Addendum)
Preparing for your Surgery  Plan for surgery on January 23, 2019 with Dr. Laurette SchimkeWendy Brewster [facility]. You will be scheduled for a robotic assisted left salpingo-oophorectomy, possible staging including omentectomy.   Pre-operative Testing -You will receive a phone call from presurgical testing at [facility] if you have not received a call already to arrange for a pre-operative testing appointment before your surgery.  This appointment normally occurs one to two weeks before your scheduled surgery.   -Bring your insurance card, copy of an advanced directive if applicable, medication list  -At that visit, you will be asked to sign a consent for a possible blood transfusion in case a transfusion becomes necessary during surgery.  The need for a blood transfusion is rare but having consent is a necessary part of your care.     -You should not be taking blood thinners or aspirin at least ten days prior to surgery unless instructed by your surgeon.  -As part of our enhanced surgical recovery pathway, you may be advised to drink a carbohydrate drink the morning of surgery (at least 3 hours before). If you are diabetic, this will be substituted with G2 gatorade in order to prevent elevated glucose levels prior to surgery.  -Do not take supplements such as fish oil (omega 3), red yeast rice, tumeric before your surgery.  -You will be advised to stop your Humira for two weeks after surgery.  Day Before Surgery at Home -You will be asked to take in a light diet the day before surgery.  Avoid carbonated beverages.  You will be advised to have nothing to eat or drink after midnight the evening before.    Eat a light diet the day before surgery.  Examples including soups, broths, toast, yogurt, mashed potatoes.  Things to avoid include carbonated beverages (fizzy beverages), raw fruits and raw vegetables, or beans.   If your bowels are filled with gas, your surgeon will have difficulty  visualizing your pelvic organs which increases your surgical risks.  Starting at 2pm the day before surgery, begin drinking two bottles of magnesium citrate and only take in clear liquids after that time.  Your role in recovery Your role is to become active as soon as directed by your doctor, while still giving yourself time to heal.  Rest when you feel tired. You will be asked to do the following in order to speed your recovery:  - Cough and breathe deeply. This helps toclear and expand your lungs and can prevent pneumonia. You may be given a spirometer to practice deep breathing. A staff member will show you how to use the spirometer. - Do mild physical activity. Walking or moving your legs help your circulation and body functions return to normal. A staff member will help you when you try to walk and will provide you with simple exercises. Do not try to get up or walk alone the first time. - Actively manage your pain. Managing your pain lets you move in comfort. We will ask you to rate your pain on a scale of zero to 10. It is your responsibility to tell your doctor or nurse where and how much you hurt so your pain can be treated.  Special Considerations -If you are diabetic, you may be placed on insulin after surgery to have closer control over your blood sugars to promote healing and recovery.  This does not mean that you will be discharged on insulin.  If applicable, your oral antidiabetics will  be resumed when you are tolerating a solid diet.  -Your final pathology results from surgery should be available around one week after surgery and the results will be relayed to you when available.  -Dr. Antionette CharLisa Jackson-Moore is the surgeon that assists your GYN Oncologist with surgery.  If you end up staying the night, the next day after your surgery you will either see Dr. Andrey Farmerossi or Dr. Antionette CharLisa Jackson-Moore.  -FMLA forms can be faxed to [phone] and please allow 5-7 business days for completion.   Pain Management After Surgery -You have been prescribed your pain medication and bowel regimen medications before surgery so that you can have these available when you are discharged from the hospital. The pain medication is for use ONLY AFTER surgery and a new prescription will not be given.   -Make sure that you have Tylenol and Ibuprofen at home to use on a regular basis after surgery for pain control. We recommend alternating the medications every hour to six hours since they work differently and are processed in the body differently for pain relief.  -Review the attached handout on narcotic use and their risks and side effects.   Bowel Regimen -You have been prescribed Sennakot-S to take nightly to prevent constipation especially if you are taking the narcotic pain medication intermittently.  It is important to prevent constipation and drink adequate amounts of liquids.  Blood Transfusion Information WHAT IS A BLOOD TRANSFUSION? A transfusion is the replacement of blood or some of its parts. Blood is made up of multiple cells which provide different functions.  Red blood cells carry oxygen and are used for blood loss replacement.  White blood cells fight against infection.  Platelets control bleeding.  Plasma helps clot blood.  Other blood products are available for specialized needs, such as hemophilia or other clotting disorders. BEFORE THE TRANSFUSION  Who gives blood for transfusions?   You may be able to donate blood to be used at a later date on yourself (autologous donation).  Relatives can be asked to donate blood. This is generally not any safer than if you have received blood from a stranger. The same precautions are taken to ensure safety when a relative's blood is donated.  Healthy volunteers who are fully evaluated to make sure their blood is safe. This is blood bank blood. Transfusion therapy is the safest it has ever been in the practice of medicine. Before blood  is taken from a donor, a complete history is taken to make sure that person has no history of diseases nor engages in risky social behavior (examples are intravenous drug use or sexual activity with multiple partners). The donor's travel history is screened to minimize risk of transmitting infections, such as malaria. The donated blood is tested for signs of infectious diseases, such as HIV and hepatitis. The blood is then tested to be sure it is compatible with you in order to minimize the chance of a transfusion reaction. If you or a relative donates blood, this is often done in anticipation of surgery and is not appropriate for emergency situations. It takes many days to process the donated blood. RISKS AND COMPLICATIONS Although transfusion therapy is very safe and saves many lives, the main dangers of transfusion include:   Getting an infectious disease.  Developing a transfusion reaction. This is an allergic reaction to something in the blood you were given. Every precaution is taken to prevent this. The decision to have a blood transfusion has been considered carefully by your  caregiver before blood is given. Blood is not given unless the benefits outweigh the risks.  AFTER SURGERY INSTRUCTIONS  01/20/2019  Return to work: 4-6 weeks if applicable  Activity: 1. Be up and out of the bed during the day.  Take a nap if needed.  You may walk up steps but be careful and use the hand rail.  Stair climbing will tire you more than you think, you may need to stop part way and rest.   2. No lifting or straining for 6 weeks.  3. No driving for 1 week(s).  Do not drive if you are taking narcotic pain medicine.  4. Shower daily.  Use soap and water on your incision and pat dry; don't rub.  No tub baths until cleared by your surgeon.   5. No sexual activity and nothing in the vagina for 8 weeks.  6. You may experience a small amount of clear drainage from your incisions, which is normal.  If the  drainage persists or increases, please call the office.  7. You may experience vaginal spotting after surgery or around the 6-8 week mark from surgery when the stitches at the top of the vagina begin to dissolve.  The spotting is normal but if you experience heavy bleeding, call our office.  8. Take Tylenol or ibuprofen first for pain and only use narcotic pain meds for severe pain not relieved by the Tylenol or Ibuprofen.  Monitor your Tylenol intake to a max of 4,000 mg a day.  Diet: 1. Low sodium Heart Healthy Diet is recommended.  2. It is safe to use a laxative, such as Miralax or Colace, if you have difficulty moving your bowels. You can take Sennakot at bedtime every evening to keep bowel movements regular and to prevent constipation.    Wound Care: 1. Keep clean and dry.  Shower daily.  Reasons to call the Doctor:  Fever - Oral temperature greater than 100.4 degrees Fahrenheit  Foul-smelling vaginal discharge  Difficulty urinating  Nausea and vomiting  Increased pain at the site of the incision that is unrelieved with pain medicine.  Difficulty breathing with or without chest pain  New calf pain especially if only on one side  Sudden, continuing increased vaginal bleeding with or without clots.   Contacts: For questions or concerns you should contact:  Dr. Everitt Amber or Dr. Janie Morning at [phone]  Joylene John, NP at [phone]  After Hours: call [phone] and have the GYN Oncologist paged/contacted

## 2019-01-20 NOTE — H&P (View-Only) (Signed)
Consult Note: Gyn-Onc  Consult was requested by Dr. Charlotta Newtonzan for the evaluation of Katelyn Arnold 43 y.o. female  CC:  Chief Complaint  Patient presents with  . Complex cyst of left ovary    Assessment/Plan:  [name]  is a [age] y.o.  year old with a complex left ovarian cyst in the setting of normal tumor markers. 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 She has a history of endometriosis and is s/p hysterectomy with RSO for fibroids and endometriosis. She is obese with a BMI of 39kg/m2.  I discussed with Katelyn Arnold my recommendation of surgical excision of the left tube and ovary given how symptomatic it is. Together we reviewed the images from her CT scan. I discussed that surgery would be technically challenging due to her obesity, and due to the parent close relationships of this ovarian cyst with the bladder and rectum.  I explained that she is at higher risk for potential complication including damage to the bladder, ureters, or intestines. While the risk of complication overall is low, it is higher than a patient without her coexisting comorbidities and prior surgical history.  I offered her close follow-up as an alternative, however, this would not alleviate her symptoms that she is experiencing from the cyst.  I discussed that oophorectomy would be associated with surgical menopause.  Given her young premenopausal age, I would recommend postoperative estrogen replacement therapy.  I discussed that this has not been shown to be associated with worsening endometriosis, nor has it been associated with an increased risk for breast cancer when taken at age prior to natural menopause.  I explained that she is at a slightly higher risk for breast cancer compared to the baseline population due to her family history of a first-degree relative with breast cancer, however the patient's risk is not further elevated by her taking estrogen at a premenopausal age as  replacement for surgical menopause.  We discussed her Humira use, and I discussed this further with [facility].  While it is associated with an increased risk for infection, surgery is not contraindicated and holding Humira pre-operatively is not absolutely necessary.  I would however, recommend holding Humira for at least 2 weeks postoperatively while she heals.  We discussed the potential increased risk for infection with a history of using this agent.  She is enthusiastic about first available surgical date, which would be with my partner Dr. Laurette SchimkeWendy Brewster on January 23, 2019.  I will have her take a bowel prep preoperatively in case there is entry into the rectum during surgical excision to avoid peritoneal contamination by large volume stool.  She is scheduled for robotic assisted left salpingo-oophorectomy, possible staging.  She understands that frozen section will be performed, and based on findings, the appropriate and technically feasible staging procedures will follow if malignancy is identified or suspected.   HPI: [name] is a 43 year old P0 who is seen in consultation at the request of Dr Charlotta Newtonzan for a left ovarian cystic mass.  The patient presented to the emergency department on January 05, 2019 with left-sided pelvic pain, nausea, constipation.  As part of her work-up she underwent a CT scan of the abdomen and pelvis.  This revealed grossly normal-appearing bowel with no wall thickening distention or inflammatory changes.  There was a 9 x 6 x 8 cm complex cystic left ovarian mass noted in the pelvis.  This appeared to be retroperitoneal to the sigmoid colon.  There  is mild inflammation and stranding within the central pelvic fat noted.  She is status post hysterectomy and RSO.  A follow-up transvaginal ultrasound scan was performed on the same day which revealed a surgically absent uterus and right ovary.  The left ovary was replaced by a complex solid and cystic mass  measuring 8.9 x 7.5 x 6 cm.  It contained solid and cystic thick-walled mass measuring 4.1 x 4.2 x 4.1 cm.  There is significant vascularity in the wall and septations of the mass.  There was no evidence for torsion.  Tumor markers were drawn including a Roma score which revealed a normal Ca1 25 of 23.6, a normal HD4 at 35.5, and a normal premenopausal Roma score of 0.35 (less than 1.14 is consistent with low likelihood of finding a malignancy at surgery).  The patient's medical history is significant for diagnosis of fibroid uterus and endometriosis for which she underwent a laparoscopic total hysterectomy and right salpingo-oophorectomy in [place] in 2013.  She reports this was an uncomplicated procedure.  She believes that the cervix was removed as part of the procedure.  She denies that they found significant adhesive disease at the time of surgery as far she is aware.  The patient also subsequently underwent a VATS procedure for a diaphragmatic hernia repair.  This was performed in 0160 and was uncomplicated.  She experiences depression, history of hypothyroidism, IBS with diarrhea, hidradenitis supra [address] [place] for which she takes Humira 40 mg subcutaneous weekly (she doses on Friday with her last documented dose on January 17, 2019).  She has been pregnant 1 time which resulted in elective termination.  The patient works as a Animal nutritionist at [employer].  The patient's mother died from breast cancer, and her maternal grandfather died from colon cancer.  Current Meds:  Outpatient Encounter Medications as of 01/20/2019  Medication Sig  . cetirizine-pseudoephedrine (ZYRTEC-D) 5-120 MG tablet Take 1 tablet by mouth daily.  Marland Kitchen docusate sodium (COLACE) 100 MG capsule Take 1 capsule (100 mg total) by mouth every 12 (twelve) hours as needed for mild constipation.  . fluticasone (FLONASE) 50 MCG/ACT nasal spray Place 2 sprays into both nostrils daily.  Marland Kitchen HUMIRA PEN 40 MG/0.4ML PNKT Inject 40  mg into the skin once a week.   Marland Kitchen HYDROcodone-acetaminophen (NORCO/VICODIN) 5-325 MG tablet Take 2 tablets by mouth every 4 (four) hours as needed.  . hydrOXYzine (ATARAX/VISTARIL) 10 MG tablet Take 10 mg by mouth 3 (three) times daily as needed for anxiety.   . NON FORMULARY Depression and anxiety medicine  . omeprazole (PRILOSEC) 20 MG capsule Take 20 mg by mouth daily before breakfast.  . polyethylene glycol powder (MIRALAX) 17 GM/SCOOP powder Take 17 g by mouth daily. Mix 1 scoop in 8 oz water daily  . venlafaxine (EFFEXOR) 75 MG tablet Take 75 mg by mouth 2 (two) times daily.  . [DISCONTINUED] Adalimumab (HUMIRA [place]) Inject into the skin.  . [DISCONTINUED] benzonatate (TESSALON) 100 MG capsule Take 1 capsule (100 mg total) by mouth every 8 (eight) hours.   No facility-administered encounter medications on file as of 01/20/2019.     Allergy:  Allergies  Allergen Reactions  . Compazine [Prochlorperazine Edisylate] Anaphylaxis    Social Hx:   Social History   Socioeconomic History  . Marital status: Divorced    Spouse name: Not on file  . Number of children: Not on file  . Years of education: Not on file  . Highest education level: Not on file  Occupational History  . Not on file  Social Needs  . Financial resource strain: Not on file  . Food insecurity    Worry: Not on file    Inability: Not on file  . Transportation needs    Medical: Not on file    Non-medical: Not on file  Tobacco Use  . Smoking status: Never Smoker  . Smokeless tobacco: Never Used  Substance and Sexual Activity  . Alcohol use: Yes    Alcohol/week: 2.0 standard drinks    Types: 2 Glasses of wine per week  . Drug use: Never  . Sexual activity: Yes  Lifestyle  . Physical activity    Days per week: Not on file    Minutes per session: Not on file  . Stress: Not on file  Relationships  . Social Musicianconnections    Talks on phone: Not on file    Gets together: Not on file    Attends religious service:  Not on file    Active member of club or organization: Not on file    Attends meetings of clubs or organizations: Not on file    Relationship status: Not on file  . Intimate partner violence    Fear of current or ex partner: Not on file    Emotionally abused: Not on file    Physically abused: Not on file    Forced sexual activity: Not on file  Other Topics Concern  . Not on file  Social History Narrative  . Not on file    Past Surgical Hx:  Past Surgical History:  Procedure Laterality Date  . ABDOMINAL HYSTERECTOMY  2015   LAPAROSCOPIC HYSTERECTOMY  . DIAPHRAGMATIC HERNIA REPAIR  09/19/2017   VIDEO BRONCHOSCOPY; VATS  . DILATION AND CURETTAGE OF UTERUS    . HERNIA REPAIR    . REPAIR OF ESOPHAGUS Right 09/19/2017   Procedure: REPAIR OF DIAPHRAGMATIC HERNIA;  Surgeon: Delight OvensGerhardt, Edward B, MD;  [facility];  Service: Thoracic;  Laterality: Right;  Marland Kitchen. VIDEO ASSISTED THORACOSCOPY Right 09/19/2017   Procedure: VIDEO ASSISTED THORACOSCOPY;  Surgeon: Delight OvensGerhardt, Edward B, MD;  [facility];  Service: Thoracic;  Laterality: Right;  Marland Kitchen. VIDEO BRONCHOSCOPY N/A 09/19/2017   Procedure: VIDEO BRONCHOSCOPY;  Surgeon: Delight OvensGerhardt, Edward B, MD;  [facility];  Service: Thoracic;  Laterality: N/A;    Past Medical Hx:  Past Medical History:  Diagnosis Date  . Anxiety   . Chronic bronchitis (HCC)   . Depression   . GERD (gastroesophageal reflux disease)   . Headache    "monthly" (09/19/2017)  . Hidradenitis suppurativa   . Hypothyroidism   . Irritable bowel syndrome (IBS)     Past Gynecological History:  See HPI No LMP recorded (lmp unknown). Patient has had a hysterectomy.  Family Hx:  Family History  Problem Relation Age of Onset  . Cirrhosis Mother   . Ulcerative colitis Mother   . Breast cancer Mother   . Colon cancer Maternal Grandfather     Review of Systems:  Constitutional  Feels well,    ENT Normal appearing ears and nares bilaterally Skin/Breast  No rash, sores,  jaundice, itching, dryness Cardiovascular  No chest pain, shortness of breath, or edema  Pulmonary  No cough or wheeze.  Gastro Intestinal  + left pelvic pain and constipation and nausea Genito Urinary  No frequency, urgency, dysuria, no bleeding Musculo Skeletal  No myalgia, arthralgia, joint swelling or pain  Neurologic  No weakness, numbness, change in gait,  Psychology  No depression, anxiety, insomnia.   Vitals:  Blood pressure (!) 142/80, pulse 82, temperature 99.1 F (37.3 C), resp. rate 18, height 5\' 2"  (1.575 m), weight 215 lb (97.5 kg), SpO2 100 %.  Physical Exam: WD in NAD Neck  Supple NROM, without any enlargements.  Lymph Node Survey No cervical supraclavicular or inguinal adenopathy Cardiovascular  Pulse normal rate, regularity and rhythm. S1 and S2 normal.  Lungs  Clear to auscultation bilateraly, without wheezes/crackles/rhonchi. Good air movement.  Skin  No rash/lesions/breakdown  Psychiatry  Alert and oriented to person, place, and time  Abdomen  Normoactive bowel sounds, abdomen soft, non-tender and obese without evidence of hernia.  Back No CVA tenderness Genito Urinary  Vulva/vagina: Normal external female genitalia.  No lesions. No discharge or bleeding.  Bladder/urethra:  No lesions or masses, well supported bladder  Vagina: normal  Cervix: palpably a ridge is present where cervix had been present - may be partial residual cervix from prior procedure versus cuff scar.  Uterus: surgically absent  Adnexa: fullness appreciated in pelvis, mobile, no nodularity Rectal  Good tone, no masses no cul de sac nodularity. Rectum does not feel adherent to mass on exam.  Extremities  No bilateral cyanosis, clubbing or edema.   Luisa DagoEmma C Peighton Mehra, MD  01/20/2019, 11:08 AM

## 2019-01-22 ENCOUNTER — Other Ambulatory Visit: Payer: Self-pay

## 2019-01-22 ENCOUNTER — Other Ambulatory Visit (HOSPITAL_COMMUNITY): Payer: Self-pay | Admitting: *Deleted

## 2019-01-22 ENCOUNTER — Encounter (HOSPITAL_COMMUNITY): Payer: Self-pay

## 2019-01-22 ENCOUNTER — Encounter (HOSPITAL_COMMUNITY)
Admission: RE | Admit: 2019-01-22 | Discharge: 2019-01-22 | Disposition: A | Discharge status: Home / Self Care | Payer: BC Managed Care – PPO | Source: Ambulatory Visit | Attending: Gynecologic Oncology | Admitting: Gynecologic Oncology

## 2019-01-22 DIAGNOSIS — N83202 Unspecified ovarian cyst, left side: Secondary | ICD-10-CM | POA: Diagnosis not present

## 2019-01-22 DIAGNOSIS — Z01812 Encounter for preprocedural laboratory examination: Secondary | ICD-10-CM | POA: Insufficient documentation

## 2019-01-22 LAB — CBC
HCT: 38.8 % (ref 36.0–46.0)
Hemoglobin: 12.3 g/dL (ref 12.0–15.0)
MCH: 30 pg (ref 26.0–34.0)
MCHC: 31.7 g/dL (ref 30.0–36.0)
MCV: 94.6 fL (ref 80.0–100.0)
Platelets: 327 10*3/uL (ref 150–400)
RBC: 4.1 MIL/uL (ref 3.87–5.11)
RDW: 13.4 % (ref 11.5–15.5)
WBC: 9.9 10*3/uL (ref 4.0–10.5)
nRBC: 0 % (ref 0.0–0.2)

## 2019-01-22 LAB — COMPREHENSIVE METABOLIC PANEL
ALT: 15 U/L (ref 0–44)
AST: 18 U/L (ref 15–41)
Albumin: 4 g/dL (ref 3.5–5.0)
Alkaline Phosphatase: 44 U/L (ref 38–126)
Anion gap: 7 (ref 5–15)
BUN: 11 mg/dL (ref 6–20)
CO2: 25 mmol/L (ref 22–32)
Calcium: 9.1 mg/dL (ref 8.9–10.3)
Chloride: 104 mmol/L (ref 98–111)
Creatinine, Ser: 0.74 mg/dL (ref 0.44–1.00)
GFR calc Af Amer: >60 mL/min (ref >60)
GFR calc non Af Amer: >60 mL/min (ref >60)
Glucose, Bld: 101 mg/dL — ABNORMAL HIGH (ref 70–99)
Potassium: 3.6 mmol/L (ref 3.5–5.1)
Sodium: 136 mmol/L (ref 135–145)
Total Bilirubin: 0.4 mg/dL (ref 0.3–1.2)
Total Protein: 8.8 g/dL — ABNORMAL HIGH (ref 6.5–8.1)

## 2019-01-22 LAB — URINALYSIS, ROUTINE W REFLEX MICROSCOPIC
Bilirubin Urine: NEGATIVE
Glucose, UA: NEGATIVE mg/dL
Hgb urine dipstick: NEGATIVE
Ketones, ur: NEGATIVE mg/dL
Leukocytes,Ua: NEGATIVE
Nitrite: NEGATIVE
Protein, ur: NEGATIVE mg/dL
Specific Gravity, Urine: 1.019 (ref 1.005–1.030)
pH: 5 (ref 5.0–8.0)

## 2019-01-22 LAB — SURGICAL PCR SCREEN
MRSA, PCR: NEGATIVE
Staphylococcus aureus: POSITIVE — AB

## 2019-01-22 LAB — ABO/RH: ABO/RH(D): A POS

## 2019-01-22 MED ORDER — SODIUM CHLORIDE 0.9 % IV SOLN
2.0000 g | INTRAVENOUS | Status: AC
  Administered 2019-01-23: 11:00:00 2 g via INTRAVENOUS
  Filled 2019-01-22 (×2): qty 2

## 2019-01-22 NOTE — Anesthesia Preprocedure Evaluation (Addendum)
Anesthesia Evaluation  Patient identified by MRN, date of birth, ID band Patient awake    Reviewed: Allergy & Precautions, H&P , NPO status , Patient's Chart, lab work & pertinent test results  Airway Mallampati: II  TM Distance: >3 FB Neck ROM: Full    Dental no notable dental hx. (+) Teeth Intact   Pulmonary neg pulmonary ROS,    Pulmonary exam normal breath sounds clear to auscultation       Cardiovascular Exercise Tolerance: Good negative cardio ROS Normal cardiovascular exam Rhythm:Regular Rate:Normal     Neuro/Psych  Headaches, PSYCHIATRIC DISORDERS Anxiety Depression  Neuromuscular disease    GI/Hepatic GERD  ,  Endo/Other  Hypothyroidism   Renal/GU      Musculoskeletal   Abdominal (+) + obese,   Peds  Hematology   Anesthesia Other Findings   Reproductive/Obstetrics                            Lab Results  Component Value Date   WBC 9.9 01/22/2019   HGB 12.3 01/22/2019   HCT 38.8 01/22/2019   MCV 94.6 01/22/2019   PLT 327 01/22/2019    Lab Results  Component Value Date   CREATININE 0.74 01/22/2019   BUN 11 01/22/2019   NA 136 01/22/2019   K 3.6 01/22/2019   CL 104 01/22/2019   CO2 25 01/22/2019    Anesthesia Physical Anesthesia Plan  ASA: III  Anesthesia Plan: General   Post-op Pain Management:    Induction: Intravenous  PONV Risk Score and Plan: 4 or greater and Treatment may vary due to age or medical condition, Ondansetron and Dexamethasone  Airway Management Planned: Oral ETT  Additional Equipment:   Intra-op Plan:   Post-operative Plan: Extubation in OR  Informed Consent: I have reviewed the patients History and Physical, chart, labs and discussed the procedure including the risks, benefits and alternatives for the proposed anesthesia with the patient or authorized representative who has indicated his/her understanding and acceptance.     Dental  advisory given  Plan Discussed with: CRNA  Anesthesia Plan Comments:        Anesthesia Quick Evaluation

## 2019-01-22 NOTE — Patient Instructions (Addendum)
YOU  HAD  A COVID 19  12-20-2018.  ONCE YOUR COVID TEST IS COMPLETED, PLEASE BEGIN THE QUARANTINE INSTRUCTIONS AS OUTLINED IN YOUR HANDOUT.                Katelyn Arnold    Your procedure is scheduled on: 01-23-2019  Report [facility]  Entr[facility]  Report to admitting at 800 AM   1 VISITOR IS ALLOWED TO WAIT IN WAITING ROOM  ONLY DAY OF YOUR SURGERY.    Call this number if you have problems the morning of surgery [phone]    Remember: BRUSH YOUR TEETH MORNING OF SURGERY AND RINSE YOUR MOUTH OUT, NO CHEWING GUM CANDY OR MINTS.   Eat a light diet the day before surgery.  Examples including soups, broths, toast, yogurt, mashed potatoes.  Things to avoid include carbonated beverages (fizzy beverages), raw fruits and raw vegetables, or beans.   STOP LIGHT DIET AT 200 PM TODAY AND BEGIN CLEAR LIQUIDS UNTIL 700 AM DAY OF SURGERY.DRINK 2 BOTTLES OF MAGNESIUM CITRATE AT 200 PM TODAY.  If your bowels are filled with gas, your surgeon will have difficulty visualizing your pelvic organs which increases your surgical risks.    CLEAR LIQUID DIET   Foods Allowed                                                                     Foods Excluded  Coffee and tea, regular and decaf                             liquids that you cannot  Plain Jell-O any favor except red or purple                                           see through such as: Fruit ices (not with fruit pulp)                                     milk, soups, orange juice  Iced Popsicles                                    All solid food                                 Cranberry, grape and apple juices Sports drinks like Gatorade Lightly seasoned clear broth or consume(fat free) Sugar, honey syrup  Sample Menu Breakfast                                Lunch                                     Supper Cranberry juice  Beef broth                            Chicken broth Jell-O                                      Grape juice                           Apple juice Coffee or tea                        Jell-O                                      Popsicle                                                Coffee or tea                        Coffee or tea  _____________________________________________________________________   NOTHING BY MOUTH EXCEPT CLEAR LIQUIDS UNTIL 700 AM.. PLEASE FINISH ENSURE DRINK PER SURGEON ORDER 3 HOURS PRIOR TO SCHEDULED SURGERY TIME WHICH NEEDS TO BE COMPLETED AT 700 AM.   Take these medicines the morning of surgery with A SIP OF WATER: HYDROCODONE IF NEEDED, OMEPRAZOLE (PRILOSEC)                               You may not have any metal on your body including hair pins and              piercings  Do not wear jewelry, make-up, lotions, powders or perfumes, deodorant             Do not wear nail polish.  Do not shave  48 hours prior to surgery.          Do not bring valuables to the hospital. [facility] IS NOT             RESPONSIBLE   FOR VALUABLES.  Contacts, dentures or bridgework may not be worn into surgery.  Leave suitcase in the car. After surgery it may be brought to your room.     Patients discharged the day of surgery will not be allowed to drive home. IF YOU ARE HAVING SURGERY AND GOING HOME THE SAME DAY, YOU MUST HAVE AN ADULT TO DRIVE YOU HOME AND BE WITH YOU FOR 24 HOURS. YOU MAY GO HOME BY TAXI OR UBER OR ORTHERWISE, BUT AN ADULT MUST ACCOMPANY YOU HOME AND STAY WITH YOU FOR 24 HOURS.  Name and phone number of your driver: Boykin PeekJIMMY FATHER CELL [phone]  Special Instructions: N/A              Please read over the following fact sheets you were given: _____________________________________________________________________             [facility] - Preparing for [facility] Before surgery, you can play an important role.  Because skin is not sterile, your skin needs to be as free of  germs as possible.  You can reduce the number of germs on your skin by  washing with CHG (chlorahexidine gluconate) soap before surgery.  CHG is an antiseptic cleaner which kills germs and bonds with the skin to continue killing germs even after washing. Please DO NOT use if you have an allergy to CHG or antibacterial soaps.  If your skin becomes reddened/irritated stop using the CHG and inform your nurse when you arrive at Short Stay. Do not shave (including legs and underarms) for at least 48 hours prior to the first CHG shower.  You may shave your face/neck. Please follow these instructions carefully:  1.  Shower with CHG Soap the night before surgery and the  morning of Surgery.  2.  If you choose to wash your hair, wash your hair first as usual with your  normal  shampoo.  3.  After you shampoo, rinse your hair and body thoroughly to remove the  shampoo.                           4.  Use CHG as you would any other liquid soap.  You can apply chg directly  to the skin and wash                       Gently with a scrungie or clean washcloth.  5.  Apply the CHG Soap to your body ONLY FROM THE NECK DOWN.   Do not use on face/ open                           Wound or open sores. Avoid contact with eyes, ears mouth and genitals (private parts).                       Wash face,  Genitals (private parts) with your normal soap.             6.  Wash thoroughly, paying special attention to the area where your surgery  will be performed.  7.  Thoroughly rinse your body with warm water from the neck down.  8.  DO NOT shower/wash with your normal soap after using and rinsing off  the CHG Soap.                9.  Pat yourself dry with a clean towel.            10.  Wear clean pajamas.            11.  Place clean sheets on your bed the night of your first shower and do not  sleep with pets. Day of Surgery : Do not apply any lotions/deodorants the morning of surgery.  Please wear clean clothes to the hospital/surgery center.  FAILURE TO FOLLOW THESE INSTRUCTIONS MAY RESULT IN THE  CANCELLATION OF YOUR SURGERY PATIENT SIGNATURE_________________________________  NURSE SIGNATURE__________________________________  ________________________________________________________________________   Rogelia MireIncentive Spirometer  An incentive spirometer is a tool that can help keep your lungs clear and active. This tool measures how well you are filling your lungs with each breath. Taking long deep breaths may help reverse or decrease the chance of developing breathing (pulmonary) problems (especially infection) following:  A long period of time when you are unable to move or be active. BEFORE THE PROCEDURE   If the spirometer includes an indicator to show your best effort, your nurse  or respiratory therapist will set it to a desired goal.  If possible, sit up straight or lean slightly forward. Try not to slouch.  Hold the incentive spirometer in an upright position. INSTRUCTIONS FOR USE  1. Sit on the edge of your bed if possible, or sit up as far as you can in bed or on a chair. 2. Hold the incentive spirometer in an upright position. 3. Breathe out normally. 4. Place the mouthpiece in your mouth and seal your lips tightly around it. 5. Breathe in slowly and as deeply as possible, raising the piston or the ball toward the top of the column. 6. Hold your breath for 3-5 seconds or for as long as possible. Allow the piston or ball to fall to the bottom of the column. 7. Remove the mouthpiece from your mouth and breathe out normally. 8. Rest for a few seconds and repeat Steps 1 through 7 at least 10 times every 1-2 hours when you are awake. Take your time and take a few normal breaths between deep breaths. 9. The spirometer may include an indicator to show your best effort. Use the indicator as a goal to work toward during each repetition. 10. After each set of 10 deep breaths, practice coughing to be sure your lungs are clear. If you have an incision (the cut made at the time of surgery),  support your incision when coughing by placing a pillow or rolled up towels firmly against it. Once you are able to get out of bed, walk around indoors and cough well. You may stop using the incentive spirometer when instructed by your caregiver.  RISKS AND COMPLICATIONS  Take your time so you do not get dizzy or light-headed.  If you are in pain, you may need to take or ask for pain medication before doing incentive spirometry. It is harder to take a deep breath if you are having pain. AFTER USE  Rest and breathe slowly and easily.  It can be helpful to keep track of a log of your progress. Your caregiver can provide you with a simple table to help with this. If you are using the spirometer at home, follow these instructions: SEEK MEDICAL CARE IF:   You are having difficultly using the spirometer.  You have trouble using the spirometer as often as instructed.  Your pain medication is not giving enough relief while using the spirometer.  You develop fever of 100.5 F (38.1 C) or higher. SEEK IMMEDIATE MEDICAL CARE IF:   You cough up bloody sputum that had not been present before.  You develop fever of 102 F (38.9 C) or greater.  You develop worsening pain at or near the incision site. MAKE SURE YOU:   Understand these instructions.  Will watch your condition.  Will get help right away if you are not doing well or get worse. Document Released: 10/09/2006 Document Revised: 08/21/2011 Document Reviewed: 12/10/2006 Exit[employer] Patient Information 2014 ExitCare, [place].   ________________________________________________________________________  WHAT IS A BLOOD TRANSFUSION? Blood Transfusion Information  A transfusion is the replacement of blood or some of its parts. Blood is made up of multiple cells which provide different functions.  Red blood cells carry oxygen and are used for blood loss replacement.  White blood cells fight against infection.  Platelets control  bleeding.  Plasma helps clot blood.  Other blood products are available for specialized needs, such as hemophilia or other clotting disorders. BEFORE THE TRANSFUSION  Who gives blood for transfusions?   Healthy  volunteers who are fully evaluated to make sure their blood is safe. This is blood bank blood. Transfusion therapy is the safest it has ever been in the practice of medicine. Before blood is taken from a donor, a complete history is taken to make sure that person has no history of diseases nor engages in risky social behavior (examples are intravenous drug use or sexual activity with multiple partners). The donor's travel history is screened to minimize risk of transmitting infections, such as malaria. The donated blood is tested for signs of infectious diseases, such as HIV and hepatitis. The blood is then tested to be sure it is compatible with you in order to minimize the chance of a transfusion reaction. If you or a relative donates blood, this is often done in anticipation of surgery and is not appropriate for emergency situations. It takes many days to process the donated blood. RISKS AND COMPLICATIONS Although transfusion therapy is very safe and saves many lives, the main dangers of transfusion include:   Getting an infectious disease.  Developing a transfusion reaction. This is an allergic reaction to something in the blood you were given. Every precaution is taken to prevent this. The decision to have a blood transfusion has been considered carefully by your caregiver before blood is given. Blood is not given unless the benefits outweigh the risks. AFTER THE TRANSFUSION  Right after receiving a blood transfusion, you will usually feel much better and more energetic. This is especially true if your red blood cells have gotten low (anemic). The transfusion raises the level of the red blood cells which carry oxygen, and this usually causes an energy increase.  The nurse  administering the transfusion will monitor you carefully for complications. HOME CARE INSTRUCTIONS  No special instructions are needed after a transfusion. You may find your energy is better. Speak with your caregiver about any limitations on activity for underlying diseases you may have. SEEK MEDICAL CARE IF:   Your condition is not improving after your transfusion.  You develop redness or irritation at the intravenous (IV) site. SEEK IMMEDIATE MEDICAL CARE IF:  Any of the following symptoms occur over the next 12 hours:  Shaking chills.  You have a temperature by mouth above 102 F (38.9 C), not controlled by medicine.  Chest, back, or muscle pain.  People around you feel you are not acting correctly or are confused.  Shortness of breath or difficulty breathing.  Dizziness and fainting.  You get a rash or develop hives.  You have a decrease in urine output.  Your urine turns a dark color or changes to pink, red, or brown. Any of the following symptoms occur over the next 10 days:  You have a temperature by mouth above 102 F (38.9 C), not controlled by medicine.  Shortness of breath.  Weakness after normal activity.  The white part of the eye turns yellow (jaundice).  You have a decrease in the amount of urine or are urinating less often.  Your urine turns a dark color or changes to pink, red, or brown. Document Released: 05/26/2000 Document Revised: 08/21/2011 Document Reviewed: 01/13/2008 [facility] Patient Information 2014 [place], [place].  _______________________________________________________________________

## 2019-01-23 ENCOUNTER — Ambulatory Visit (HOSPITAL_COMMUNITY)
Admission: RE | Admit: 2019-01-23 | Discharge: 2019-01-23 | Disposition: A | Discharge status: Home / Self Care | Payer: BC Managed Care – PPO | Attending: Gynecologic Oncology | Admitting: Gynecologic Oncology

## 2019-01-23 ENCOUNTER — Encounter (HOSPITAL_COMMUNITY): Payer: Self-pay | Admitting: *Deleted

## 2019-01-23 ENCOUNTER — Ambulatory Visit (HOSPITAL_COMMUNITY): Payer: BC Managed Care – PPO | Admitting: Certified Registered"

## 2019-01-23 ENCOUNTER — Encounter (HOSPITAL_COMMUNITY)
Admission: RE | Disposition: A | Discharge status: Home / Self Care | Payer: Self-pay | Source: Home / Self Care | Attending: Gynecologic Oncology

## 2019-01-23 DIAGNOSIS — Z90710 Acquired absence of both cervix and uterus: Secondary | ICD-10-CM | POA: Insufficient documentation

## 2019-01-23 DIAGNOSIS — Z79899 Other long term (current) drug therapy: Secondary | ICD-10-CM | POA: Insufficient documentation

## 2019-01-23 DIAGNOSIS — N801 Endometriosis of ovary: Secondary | ICD-10-CM | POA: Diagnosis not present

## 2019-01-23 DIAGNOSIS — Z6838 Body mass index (BMI) 38.0-38.9, adult: Secondary | ICD-10-CM | POA: Insufficient documentation

## 2019-01-23 DIAGNOSIS — K219 Gastro-esophageal reflux disease without esophagitis: Secondary | ICD-10-CM | POA: Diagnosis not present

## 2019-01-23 DIAGNOSIS — L732 Hidradenitis suppurativa: Secondary | ICD-10-CM | POA: Diagnosis not present

## 2019-01-23 DIAGNOSIS — Z803 Family history of malignant neoplasm of breast: Secondary | ICD-10-CM | POA: Insufficient documentation

## 2019-01-23 DIAGNOSIS — F329 Major depressive disorder, single episode, unspecified: Secondary | ICD-10-CM | POA: Diagnosis not present

## 2019-01-23 DIAGNOSIS — Z800 Family history of malignant neoplasm of digestive organs: Secondary | ICD-10-CM | POA: Insufficient documentation

## 2019-01-23 DIAGNOSIS — N83202 Unspecified ovarian cyst, left side: Secondary | ICD-10-CM | POA: Diagnosis present

## 2019-01-23 DIAGNOSIS — Z888 Allergy status to other drugs, medicaments and biological substances status: Secondary | ICD-10-CM | POA: Diagnosis not present

## 2019-01-23 DIAGNOSIS — E669 Obesity, unspecified: Secondary | ICD-10-CM | POA: Diagnosis not present

## 2019-01-23 DIAGNOSIS — F419 Anxiety disorder, unspecified: Secondary | ICD-10-CM | POA: Diagnosis not present

## 2019-01-23 DIAGNOSIS — N83292 Other ovarian cyst, left side: Secondary | ICD-10-CM

## 2019-01-23 HISTORY — PX: ROBOTIC ASSISTED SALPINGO OOPHERECTOMY: SHX6082

## 2019-01-23 HISTORY — PX: ABLATION ON ENDOMETRIOSIS: SHX5787

## 2019-01-23 LAB — TYPE AND SCREEN
ABO/RH(D): A POS
Antibody Screen: NEGATIVE

## 2019-01-23 SURGERY — SALPINGO-OOPHORECTOMY, ROBOT-ASSISTED
Anesthesia: General | Site: Abdomen | Laterality: Left

## 2019-01-23 MED ORDER — MIDAZOLAM HCL 5 MG/5ML IJ SOLN
INTRAMUSCULAR | Status: DC | PRN
  Administered 2019-01-23: 2 mg via INTRAVENOUS

## 2019-01-23 MED ORDER — BUPIVACAINE HCL 0.25 % IJ SOLN
INTRAMUSCULAR | Status: AC
  Filled 2019-01-23: qty 1

## 2019-01-23 MED ORDER — FENTANYL CITRATE (PF) 100 MCG/2ML IJ SOLN
INTRAMUSCULAR | Status: AC
  Filled 2019-01-23: qty 2

## 2019-01-23 MED ORDER — PROPOFOL 10 MG/ML IV BOLUS
INTRAVENOUS | Status: DC | PRN
  Administered 2019-01-23: 160 mg via INTRAVENOUS

## 2019-01-23 MED ORDER — ESTRADIOL 0.1 MG/24HR TD PTWK: 0.1000 mg | MEDICATED_PATCH | TRANSDERMAL | 12 refills | Status: AC

## 2019-01-23 MED ORDER — ROCURONIUM BROMIDE 10 MG/ML (PF) SYRINGE
PREFILLED_SYRINGE | INTRAVENOUS | Status: DC | PRN
  Administered 2019-01-23: 10 mg via INTRAVENOUS
  Administered 2019-01-23: 50 mg via INTRAVENOUS
  Administered 2019-01-23: 20 mg via INTRAVENOUS

## 2019-01-23 MED ORDER — SCOPOLAMINE 1 MG/3DAYS TD PT72
1.0000 | MEDICATED_PATCH | TRANSDERMAL | Status: AC
  Administered 2019-01-23: 1.5 mg via TRANSDERMAL
  Administered 2019-01-23: 11:00:00 1 via TRANSDERMAL
  Filled 2019-01-23: qty 1

## 2019-01-23 MED ORDER — MIDAZOLAM HCL 2 MG/2ML IJ SOLN
INTRAMUSCULAR | Status: AC
  Filled 2019-01-23: qty 2

## 2019-01-23 MED ORDER — KETOROLAC TROMETHAMINE 30 MG/ML IJ SOLN
INTRAMUSCULAR | Status: AC
  Filled 2019-01-23: qty 1

## 2019-01-23 MED ORDER — FENTANYL CITRATE (PF) 100 MCG/2ML IJ SOLN
25.0000 ug | INTRAMUSCULAR | Status: DC | PRN
  Administered 2019-01-23 (×3): 50 ug via INTRAVENOUS

## 2019-01-23 MED ORDER — LIDOCAINE 20MG/ML (2%) 15 ML SYRINGE OPTIME
INTRAMUSCULAR | Status: DC | PRN
  Administered 2019-01-23: 1.5 mg/kg/h via INTRAVENOUS

## 2019-01-23 MED ORDER — FENTANYL CITRATE (PF) 250 MCG/5ML IJ SOLN
INTRAMUSCULAR | Status: AC
  Filled 2019-01-23: qty 5

## 2019-01-23 MED ORDER — SUGAMMADEX SODIUM 200 MG/2ML IV SOLN
INTRAVENOUS | Status: DC | PRN
  Administered 2019-01-23: 200 mg via INTRAVENOUS

## 2019-01-23 MED ORDER — FENTANYL CITRATE (PF) 100 MCG/2ML IJ SOLN
25.0000 ug | INTRAMUSCULAR | Status: DC | PRN
  Administered 2019-01-23: 50 ug via INTRAVENOUS

## 2019-01-23 MED ORDER — SODIUM CHLORIDE 0.9% FLUSH: 3.0000 mL | Freq: Two times a day (BID) | INTRAVENOUS | Status: DC

## 2019-01-23 MED ORDER — CELECOXIB 200 MG PO CAPS
400.0000 mg | ORAL_CAPSULE | ORAL | Status: AC
  Administered 2019-01-23: 400 mg via ORAL
  Filled 2019-01-23: qty 2

## 2019-01-23 MED ORDER — KETOROLAC TROMETHAMINE 30 MG/ML IJ SOLN
30.0000 mg | Freq: Once | INTRAMUSCULAR | Status: AC | PRN
  Administered 2019-01-23: 30 mg via INTRAVENOUS

## 2019-01-23 MED ORDER — FENTANYL CITRATE (PF) 100 MCG/2ML IJ SOLN
INTRAMUSCULAR | Status: DC | PRN
  Administered 2019-01-23 (×9): 50 ug via INTRAVENOUS

## 2019-01-23 MED ORDER — SUCCINYLCHOLINE CHLORIDE 200 MG/10ML IV SOSY
PREFILLED_SYRINGE | INTRAVENOUS | Status: DC | PRN
  Administered 2019-01-23: 100 mg via INTRAVENOUS

## 2019-01-23 MED ORDER — ONDANSETRON HCL 4 MG/2ML IJ SOLN: 4.0000 mg | Freq: Once | INTRAMUSCULAR | Status: DC | PRN

## 2019-01-23 MED ORDER — OXYCODONE HCL 5 MG PO TABS: 5.0000 mg | ORAL_TABLET | Freq: Once | ORAL | Status: DC | PRN

## 2019-01-23 MED ORDER — ENOXAPARIN SODIUM 40 MG/0.4ML ~~LOC~~ SOLN
40.0000 mg | SUBCUTANEOUS | Status: AC
  Administered 2019-01-23: 40 mg via SUBCUTANEOUS
  Filled 2019-01-23: qty 0.4

## 2019-01-23 MED ORDER — ONDANSETRON HCL 4 MG/2ML IJ SOLN
INTRAMUSCULAR | Status: DC | PRN
  Administered 2019-01-23: 4 mg via INTRAVENOUS

## 2019-01-23 MED ORDER — LIDOCAINE 2% (20 MG/ML) 5 ML SYRINGE
INTRAMUSCULAR | Status: DC | PRN
  Administered 2019-01-23: 100 mg via INTRAVENOUS

## 2019-01-23 MED ORDER — STERILE WATER FOR INJECTION IJ SOLN
INTRAMUSCULAR | Status: AC
  Filled 2019-01-23: qty 10

## 2019-01-23 MED ORDER — DEXAMETHASONE SODIUM PHOSPHATE 4 MG/ML IJ SOLN: 4.0000 mg | INTRAMUSCULAR | Status: DC

## 2019-01-23 MED ORDER — LACTATED RINGERS IV SOLN
INTRAVENOUS | Status: DC
  Administered 2019-01-23 (×2): via INTRAVENOUS

## 2019-01-23 MED ORDER — PHENYLEPHRINE 40 MCG/ML (10ML) SYRINGE FOR IV PUSH (FOR BLOOD PRESSURE SUPPORT)
PREFILLED_SYRINGE | INTRAVENOUS | Status: DC | PRN
  Administered 2019-01-23 (×2): 80 ug via INTRAVENOUS

## 2019-01-23 MED ORDER — ACETAMINOPHEN 500 MG PO TABS
1000.0000 mg | ORAL_TABLET | ORAL | Status: AC
  Administered 2019-01-23: 1000 mg via ORAL
  Filled 2019-01-23: qty 2

## 2019-01-23 MED ORDER — DEXAMETHASONE SODIUM PHOSPHATE 10 MG/ML IJ SOLN
INTRAMUSCULAR | Status: DC | PRN
  Administered 2019-01-23: 10 mg via INTRAVENOUS

## 2019-01-23 MED ORDER — ESMOLOL HCL 100 MG/10ML IV SOLN
INTRAVENOUS | Status: DC | PRN
  Administered 2019-01-23 (×2): 20 mg via INTRAVENOUS

## 2019-01-23 MED ORDER — OXYCODONE HCL 5 MG/5ML PO SOLN: 5.0000 mg | Freq: Once | ORAL | Status: DC | PRN

## 2019-01-23 MED ORDER — LACTATED RINGERS IR SOLN
Status: DC | PRN
  Administered 2019-01-23: 1000 mL

## 2019-01-23 MED ORDER — LIDOCAINE 20MG/ML (2%) 15 ML SYRINGE OPTIME: INTRAMUSCULAR | Status: DC | PRN

## 2019-01-23 MED ORDER — GABAPENTIN 300 MG PO CAPS
300.0000 mg | ORAL_CAPSULE | ORAL | Status: AC
  Administered 2019-01-23: 300 mg via ORAL
  Filled 2019-01-23: qty 1

## 2019-01-23 MED ORDER — KETAMINE HCL 10 MG/ML IJ SOLN
INTRAMUSCULAR | Status: DC | PRN
  Administered 2019-01-23: 30 mg via INTRAVENOUS

## 2019-01-23 MED ORDER — BUPIVACAINE HCL (PF) 0.25 % IJ SOLN
INTRAMUSCULAR | Status: DC | PRN
  Administered 2019-01-23: 13 mL

## 2019-01-23 SURGICAL SUPPLY — 47 items
COVER BACK TABLE 60X90IN (DRAPES) IMPLANT
COVER TIP SHEARS 8 DVNC (MISCELLANEOUS) ×2 IMPLANT
COVER TIP SHEARS 8MM DA VINCI (MISCELLANEOUS) ×1
COVER WAND RF STERILE (DRAPES) IMPLANT
DECANTER SPIKE VIAL GLASS SM (MISCELLANEOUS) IMPLANT
DERMABOND ADVANCED (GAUZE/BANDAGES/DRESSINGS) ×1
DERMABOND ADVANCED .7 DNX12 (GAUZE/BANDAGES/DRESSINGS) ×2 IMPLANT
DRAPE ARM DVNC X/XI (DISPOSABLE) ×8 IMPLANT
DRAPE COLUMN DVNC XI (DISPOSABLE) ×2 IMPLANT
DRAPE DA VINCI XI ARM (DISPOSABLE) ×4
DRAPE DA VINCI XI COLUMN (DISPOSABLE) ×1
DRAPE SHEET LG 3/4 BI-LAMINATE (DRAPES) ×3 IMPLANT
DRAPE SURG IRRIG POUCH 19X23 (DRAPES) ×3 IMPLANT
ELECT REM PT RETURN 15FT ADLT (MISCELLANEOUS) ×3 IMPLANT
GLOVE BIO SURGEON STRL SZ 6.5 (GLOVE) ×15 IMPLANT
GLOVE BIO SURGEON STRL SZ7.5 (GLOVE) ×9 IMPLANT
GLOVE INDICATOR 8.0 STRL GRN (GLOVE) ×6 IMPLANT
GOWN STRL REUS W/ TWL LRG LVL3 (GOWN DISPOSABLE) ×2 IMPLANT
GOWN STRL REUS W/TWL LRG LVL3 (GOWN DISPOSABLE) ×1
GOWN STRL REUS W/TWL XL LVL3 (GOWN DISPOSABLE) ×6 IMPLANT
HOLDER FOLEY CATH W/STRAP (MISCELLANEOUS) ×3 IMPLANT
IRRIG SUCT STRYKERFLOW 2 WTIP (MISCELLANEOUS) ×3
IRRIGATION SUCT STRKRFLW 2 WTP (MISCELLANEOUS) ×2 IMPLANT
KIT PROCEDURE DA VINCI SI (MISCELLANEOUS)
KIT PROCEDURE DVNC SI (MISCELLANEOUS) IMPLANT
KIT TURNOVER KIT A (KITS) IMPLANT
MANIPULATOR UTERINE 4.5 ZUMI (MISCELLANEOUS) IMPLANT
NEEDLE HYPO 22GX1.5 SAFETY (NEEDLE) ×3 IMPLANT
NEEDLE SPNL 18GX3.5 QUINCKE PK (NEEDLE) IMPLANT
PACK ROBOT GYN CUSTOM WL (TRAY / TRAY PROCEDURE) ×3 IMPLANT
PAD POSITIONING PINK XL (MISCELLANEOUS) ×3 IMPLANT
PORT ACCESS TROCAR AIRSEAL 12 (TROCAR) ×2 IMPLANT
PORT ACCESS TROCAR AIRSEAL 5M (TROCAR) ×1
POUCH SPECIMEN RETRIEVAL 10MM (ENDOMECHANICALS) ×3 IMPLANT
SEAL CANN UNIV 5-8 DVNC XI (MISCELLANEOUS) ×8 IMPLANT
SEAL XI 5MM-8MM UNIVERSAL (MISCELLANEOUS) ×4
SET TRI-LUMEN FLTR TB AIRSEAL (TUBING) ×3 IMPLANT
SUT VIC AB 0 CT1 27 (SUTURE) ×1
SUT VIC AB 0 CT1 27XBRD ANTBC (SUTURE) ×2 IMPLANT
SUT VIC AB 4-0 PS2 27 (SUTURE) ×6 IMPLANT
SUT VLOC 180 0 9IN  GS21 (SUTURE)
SUT VLOC 180 0 9IN GS21 (SUTURE) IMPLANT
TOWEL OR NON WOVEN STRL DISP B (DISPOSABLE) ×3 IMPLANT
TRAP SPECIMEN MUCOUS 40CC (MISCELLANEOUS) ×3 IMPLANT
TRAY FOLEY MTR SLVR 16FR STAT (SET/KITS/TRAYS/PACK) ×3 IMPLANT
UNDERPAD 30X30 (UNDERPADS AND DIAPERS) ×3 IMPLANT
WATER STERILE IRR 1000ML POUR (IV SOLUTION) ×3 IMPLANT

## 2019-01-23 NOTE — Anesthesia Postprocedure Evaluation (Signed)
Anesthesia Post Note  Patient: Katelyn Arnold  Procedure(s) Performed: XI ROBOTIC ASSISTED LEFT SALPINGO OOPHORECTOMY, (Left Abdomen) ABLATION ON ENDOMETRIOSIS (Bilateral )     Patient location during evaluation: PACU Anesthesia Type: General Level of consciousness: awake and alert Pain management: pain level controlled Vital Signs Assessment: post-procedure vital signs reviewed and stable Respiratory status: spontaneous breathing, nonlabored ventilation, respiratory function stable and patient connected to nasal cannula oxygen Cardiovascular status: blood pressure returned to baseline and stable Postop Assessment: no apparent nausea or vomiting Anesthetic complications: no    Last Vitals:  Vitals:   01/23/19 1500 01/23/19 1532  BP: (!) 136/94 (!) 169/73  Pulse: 78 87  Resp: 20 18  Temp: (!) 36.4 C   SpO2: 94% 99%    Last Pain:  Vitals:   01/23/19 1532  TempSrc:   PainSc: 0-No pain                 Audry Pili

## 2019-01-23 NOTE — Op Note (Signed)
OPERATIVE NOTE  Preoperative Diagnosis: H/O endometriosis, left adnexal mass  Postoperative Diagnosis: endometriosis, benign left adnexal mass  Procedure(s) Performed: Robotic resection of left retroperitoneal  endometrioma,  ablation of endometriotic lesions.  Surgeon: Francetta Found.  Skeet Latch, M.D. PhD  Assistant Surgeon:Lisa Delsa Sale MD.   Specimens: Left ovary and tube, pelvic washings  Estimated Blood Loss:, 47WG  Complications: None  Indication for Procedure: This is a 43 y/o with prior hysterectomy RSO for myoma and endometriosis.  Presents with left abdominal pain and imaging notable to retroperitoneal left mass approximately 10cm.    Operative Findings:  Small volume hemmorhagic fluid within the abdomen and pelvis. Retroperitoneal mass inferior to the colon surrounded by hemorrhagic material.  Base of the fluid collection notable for changes c/w endometriosis.  Anterior abdominal and left pelvic peritoneum with blister-like lesions c/w endometriosis.  Cecum is adherent to the right abdominal sidewall.  At the completion of the procedure all the visible endometriotic cystic structures have been ablated.    .    Procedure: Patient was taken to the operating room and placed under general endotracheal anesthesia without any difficulty. She is placed in the dorsal lithotomy position and secured to the operative table A sponge stick was placed within the vagina.   An OG tube was present and functional. At an area on the left in line with the nipple approximately 2 cm below a 5 mm Optiview inserted under direct visualization. The abdomen was insufflated to 15 mm of mercury and the pressure never deviated above that throughout the remainder of the procedure. Maximum Trendelenburg positioning was obtained. At approximately 22 cm proximal to the symphysis pubis an incision was made just superior to the umbilicus. This area was infiltrated with lidocaine as well as the location 10 cm lateral to  this incision bilaterally.  8 millimeter robotic ports were placed in the other 3 incisions. The left upper quadrant port site was replaced with a 12 mm port. This was all completed under direct visualization. The small and large bowel were reflected as much as possible into the upper abdomen. The robot was docked and instruments placed.  Pelvic washing were colleted and the existing hemorrhagic fluid was suctioned.  Using sharp dissection the rectosigmoid was separated from the left pelvic sidewall from the left common iliac artery to the vaginal cuff.  The retroperitoneal space was entered with return of a significant amount of hemorrhagic fluid.  The external iliac artery and vein were identified and the   left ureter was identified. The mass was transect from the posterior broad ligament. The left gonadal vessels were cauterized and transected. The specimen was placed in an endocatch bag, where it was morcellated and removed.  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    The endometriotic implants were ablated.  The surgical sites was irrigated.    The pelvis was copiously irrigated and drained and hemostasis was assured. T  The instruments were removed from the abdomen and pelvis and the port sites irrigated. The LUQ fascia was closed with an interrupted 0 Vicryl  suture.  Skin incisions were closed with a subcuticular suture. Dermabond was placed over the incisions.  The vaginal vault was cleared with a moist sponge stick.  Sponge, lap and needle counts were correct x 3.    The patient had sequential compression devices and preoperative Lovenox for VTE prophylaxis and will receive Lovenox postoperatively.          Disposition: PACU - hemodynamically stable.  Condition:stable Foley draining clear urine.

## 2019-01-23 NOTE — Discharge Instructions (Signed)
Return to work: several days to 2 weeks.  Activity: 1. Be up and out of the bed during the day.  Take a nap if needed.  You may walk up steps but be careful and use the hand rail.  Stair climbing will tire you more than you think, you may need to stop part way and rest.   2. No lifting or straining for 4 weeks.  3. No driving for 1 weeks.  Do Not drive if you are taking narcotic pain medicine.  4. Shower daily.  Use soap and water on your incision and pat dry; don't rub.   5. No sexual activity and nothing in the vagina for 8 weeks.  Medications:  - Take ibuprofen and tylenol first line for pain control. Take these regularly (every 6 hours) to decrease the build up of pain.  - If necessary, for severe pain not relieved by ibuprofen, contact Dr Leone Brand office and you will be prescribed percocet.  - While taking percocet you should take sennakot every night to reduce the likelihood of constipation. If this causes diarrhea, stop its use.  Diet: 1. Low sodium Heart Healthy Diet is recommended.  2. It is safe to use a laxative if you have difficulty moving your bowels.   Wound Care: 1. Keep clean and dry.  Shower daily.  Reasons to call the Doctor:   Fever - Oral temperature greater than 100.4 degrees Fahrenheit  Foul-smelling vaginal discharge  Difficulty urinating  Nausea and vomiting  Increased pain at the site of the incision that is unrelieved with pain medicine.  Difficulty breathing with or without chest pain  New calf pain especially if only on one side  Sudden, continuing increased vaginal bleeding with or without clots.   Follow-up: 1. See Janie Morning in 4 weeks.  Contacts: For questions or concerns you should contact:  Dr. Janie Morning at [phone] After hours and on week-ends call [phone] and ask to speak to the physician on call for Gynecologic Oncology

## 2019-01-23 NOTE — Anesthesia Procedure Notes (Signed)
Procedure Name: Intubation Date/Time: 01/23/2019 11:29 AM Performed by: Silas Sacramento, CRNA Pre-anesthesia Checklist: Patient identified, Emergency Drugs available, Suction available and Patient being monitored Patient Re-evaluated:Patient Re-evaluated prior to induction Oxygen Delivery Method: Circle system utilized Preoxygenation: Pre-oxygenation with 100% oxygen Induction Type: IV induction and Rapid sequence Ventilation: Mask ventilation without difficulty Laryngoscope Size: Mac and 3 Grade View: Grade I Tube type: Oral Tube size: 7.0 mm Number of attempts: 1 Airway Equipment and Method: Stylet and Oral airway Placement Confirmation: ETT inserted through vocal cords under direct vision,  positive ETCO2 and breath sounds checked- equal and bilateral Secured at: 23 cm Tube secured with: Tape Dental Injury: Teeth and Oropharynx as per pre-operative assessment

## 2019-01-23 NOTE — Transfer of Care (Signed)
Immediate Anesthesia Transfer of Care Note  Patient: Katelyn Arnold  Procedure(s) Performed: XI ROBOTIC ASSISTED LEFT SALPINGO OOPHORECTOMY, (Left Abdomen) ABLATION ON ENDOMETRIOSIS (Bilateral )  Patient Location: PACU  Anesthesia Type:General  Level of Consciousness: drowsy, patient cooperative and responds to stimulation  Airway & Oxygen Therapy: Patient Spontanous Breathing and Patient connected to face mask oxygen  Post-op Assessment: Report given to RN and Post -op Vital signs reviewed and stable  Post vital signs: Reviewed and stable  Last Vitals:  Vitals Value Taken Time  BP 157/111 01/23/19 1336  Temp 36.4 C 01/23/19 1336  Pulse 73 01/23/19 1340  Resp 19 01/23/19 1340  SpO2 100 % 01/23/19 1340  Vitals shown include unvalidated device data.  Last Pain:  Vitals:   01/23/19 1336  TempSrc:   PainSc: 7       Patients Stated Pain Goal: 4 (53/[id number]/82 [id number])  Complications: No apparent anesthesia complications

## 2019-01-23 NOTE — Interval H&P Note (Signed)
History and Physical Interval Note:  01/23/2019 10:46 AM  Katelyn Arnold  has presented today for surgery, with the diagnosis of LEFT OVARIAN CYST.  The various methods of treatment have been discussed with the patient and family. After consideration of risks, benefits and other options for treatment, the patient has consented to  Procedure(s): XI ROBOTIC ASSISTED LEFT SALPINGO OOPHORECTOMY, POSSIBLE STAGING (Left) as a surgical intervention.  The patient's history has been reviewed, patient examined, no change in status, stable for surgery.  I have reviewed the patient's chart and labs.  Questions were answered to the patient's satisfaction.     Janie Morning

## 2019-01-24 ENCOUNTER — Encounter (HOSPITAL_COMMUNITY): Payer: Self-pay | Admitting: Gynecologic Oncology

## 2019-01-24 ENCOUNTER — Telehealth: Payer: Self-pay | Admitting: Oncology

## 2019-01-24 NOTE — Telephone Encounter (Signed)
Called Leigha back and advised her that it is ok that she did not receive an incentive spirometer and to couch and deep breath every hour while awake.  She verbalized understanding and agreement.

## 2019-01-24 NOTE — Telephone Encounter (Signed)
Called Katelyn Arnold to see how she is feeling after surgery.  She said she is having a little bit of pain.  She has taken one tablet of oxycodone and also an ibuprofen.  She denies having any nausea.  She said her stomach is "rumbling."  She does not have senokot-s yet and is going to get it today.  She also said she did not get an incentive spirometer.  She has been coughing herself and is wondering if she needs to do anything else.

## 2019-01-27 ENCOUNTER — Telehealth: Payer: Self-pay

## 2019-01-27 NOTE — Telephone Encounter (Signed)
I spoke with ms. Abood this am. Relayed her biopsy results. She verbalized understanding. She state she is still a little sore since surgery. She is afebrile and denies n.v.d. Pt has not had a bm since surgery I encouraged her to ambulate, drink fluids, and take the senokot as prescribed.

## 2019-02-12 NOTE — Progress Notes (Signed)
GYN ONCOLOGY OFFICE VISIT   Referring physician: Dr. Charlotta Arnold f   Katelyn Arnold 43 y.o. female  CC: Post op removal of retrosigmoid endometrioma  Assessment/Plan:  [name]  is a 43 y.o.  year old with a complex left ovarian cyst in the setting of normal tumor markers. 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 She has a history of endometriosis and is s/p hysterectomy with RSO for fibroids and endometriosis.  S/P robotic assisted left -ovarian endometrioma Follow-up with Dr. Charlotta Arnold   HPI: [name] is a 43 year old P0 who is seen in consultation at the request of Dr Katelyn Arnold for a left ovarian cystic mass.  The patient presented to the emergency department on January 05, 2019 with left-sided pelvic pain, nausea, constipation.  As part of her work-up she underwent a CT scan of the abdomen and pelvis.  This revealed grossly normal-appearing bowel with no wall thickening distention or inflammatory changes.  There was a 9 x 6 x 8 cm complex cystic left ovarian mass noted in the pelvis.  This appeared to be retroperitoneal to the sigmoid colon.  There is mild inflammation and stranding within the central pelvic fat noted.  She is status post hysterectomy and RSO.  A follow-up transvaginal ultrasound scan was performed on the same day which revealed a surgically absent uterus and right ovary.  The left ovary was replaced by a complex solid and cystic mass measuring 8.9 x 7.5 x 6 cm.  It contained solid and cystic thick-walled mass measuring 4.1 x 4.2 x 4.1 cm.  There is significant vascularity in the wall and septations of the mass.  There was no evidence for torsion.  Tumor markers were drawn including a Roma score which revealed a normal Ca1 25 of 23.6, a normal HD4 at 35.5, and a normal premenopausal Roma score of 0.35 (less than 1.14 is consistent with low likelihood of finding a malignancy at surgery).  The patient's medical history is significant for  diagnosis of fibroid uterus and endometriosis for which she underwent a laparoscopic total hysterectomy and right salpingo-oophorectomy in [place] in 2013.  She reports this was an uncomplicated procedure.  She believes that the cervix was removed as part of the procedure.  She denies that they found significant adhesive disease at the time of surgery as far she is aware.  The patient also subsequently underwent a VATS procedure for a diaphragmatic hernia repair.  This was performed in 2019 and was uncomplicated.  She experiences depression, history of hypothyroidism, IBS with diarrhea, hidradenitis supra T. Katelyn Arnold for which she takes Humira 40 mg subcutaneous weekly (she doses on Friday with her last documented dose on January 17, 2019).  She has been pregnant 1 time which resulted in elective termination.  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  The patient's mother died from breast cancer, and her maternal grandfather died from colon cancer.  01/23/2019 Robotic resection of left retroperitoneal  endometrioma,  ablation of endometriotic lesions. Ovary was beneath the rectosigmoid colon  - ENDOMETRIOMA OF OVARY. - NO DISTINCT FALLOPIAN TUBE TISSUE PRESENT FOR EVALUATION.   Current Meds:  Outpatient Encounter Medications as of 02/13/2019  Medication Sig  . estradiol (CLIMARA) 0.1 mg/24hr patch Place 1 patch (0.1 mg total) onto the skin once a week.  . fluticasone (FLONASE) 50 MCG/ACT nasal spray Place 2 sprays into both nostrils daily. (Patient taking differently: Place 2 sprays into both nostrils daily as needed (allergies.). )  .  HUMIRA PEN 40 MG/0.4ML PNKT Inject 40 mg into the skin every Friday.   Marland Kitchen HYDROcodone-acetaminophen (NORCO/VICODIN) 5-325 MG tablet Take 2 tablets by mouth every 4 (four) hours as needed. (Patient taking differently: Take 2 tablets by mouth every 4 (four) hours as needed (for pain.). )  . hydrOXYzine (ATARAX/VISTARIL) 10 MG tablet Take 10 mg by  mouth 3 (three) times daily as needed for anxiety.   Marland Kitchen ibuprofen (ADVIL) 800 MG tablet Take 1 tablet (800 mg total) by mouth every 8 (eight) hours as needed for moderate pain. For AFTER surgery only  . omeprazole (PRILOSEC) 20 MG capsule Take 20 mg by mouth daily before breakfast.  . oxyCODONE (OXY IR/ROXICODONE) 5 MG immediate release tablet Take 1 tablet (5 mg total) by mouth every 4 (four) hours as needed for severe pain. For AFTER surgery only, do not take and drive  . polyethylene glycol powder (MIRALAX) 17 GM/SCOOP powder Take 17 g by mouth daily. Mix 1 scoop in 8 oz water daily (Patient taking differently: Take 17 g by mouth daily as needed (constipation.). Mix 1 scoop in 8 oz water daily)  . senna-docusate (SENOKOT-S) 8.6-50 MG tablet Take 2 tablets by mouth at bedtime. For AFTER surgery, do not take if having diarrhea  . venlafaxine XR (EFFEXOR-XR) 75 MG 24 hr capsule Take 75 mg by mouth daily at 2 PM.   No facility-administered encounter medications on file as of 02/13/2019.     Allergy:  Allergies  Allergen Reactions  . Compazine [Prochlorperazine Edisylate] Anaphylaxis    Social Hx:   Social History   Socioeconomic History  . Marital status: Divorced    Spouse name: Not on file  . Number of children: Not on file  . Years of education: Not on file  . Highest education level: Not on file  Occupational History  . Not on file  Social Needs  . Financial resource strain: Not on file  . Food insecurity    Worry: Not on file    Inability: Not on file  . Transportation needs    Medical: Not on file    Non-medical: Not on file  Tobacco Use  . Smoking status: Never Smoker  . Smokeless tobacco: Never Used  Substance and Sexual Activity  . Alcohol use: Yes    Alcohol/week: 2.0 standard drinks    Types: 2 Glasses of wine per week  . Drug use: Never  . Sexual activity: Yes  Lifestyle  . Physical activity    Days per week: Not on file    Minutes per session: Not on file  .  Stress: Not on file  Relationships  . Social Musician on phone: Not on file    Gets together: Not on file    Attends religious service: Not on file    Active member of club or organization: Not on file    Attends meetings of clubs or organizations: Not on file    Relationship status: Not on file  . Intimate partner violence    Fear of current or ex partner: Not on file    Emotionally abused: Not on file    Physically abused: Not on file    Forced sexual activity: Not on file  Other Topics Concern  . Not on file  Social History Narrative  . Not on file    Past Surgical Hx:  Past Surgical History:  Procedure Laterality Date  . ABDOMINAL HYSTERECTOMY  2013   LAPAROSCOPIC HYSTERECTOMY  .  ABLATION ON ENDOMETRIOSIS Bilateral 01/23/2019   Procedure: ABLATION ON ENDOMETRIOSIS;  Surgeon: Janie Morning, MD;  Location: [facility];  Service: Gynecology;  Laterality: Bilateral;  . DIAPHRAGMATIC HERNIA REPAIR  09/19/2017   VIDEO BRONCHOSCOPY; VATS  . DILATION AND CURETTAGE OF UTERUS    . REPAIR OF ESOPHAGUS Right 09/19/2017   Procedure: REPAIR OF DIAPHRAGMATIC HERNIA;  Surgeon: Grace Isaac, MD;  Location: [facility];  Service: Thoracic;  Laterality: Right;  . ROBOTIC ASSISTED SALPINGO OOPHERECTOMY Left 01/23/2019   Procedure: XI ROBOTIC ASSISTED LEFT SALPINGO OOPHORECTOMY,;  Surgeon: Janie Morning, MD;  Location: [facility];  Service: Gynecology;  Laterality: Left;  resection of retroperitoneal mass.  Marland Kitchen VIDEO ASSISTED THORACOSCOPY Right 09/19/2017   Procedure: VIDEO ASSISTED THORACOSCOPY;  Surgeon: Grace Isaac, MD;  Location: [facility];  Service: Thoracic;  Laterality: Right;  Marland Kitchen VIDEO BRONCHOSCOPY N/A 09/19/2017   Procedure: VIDEO BRONCHOSCOPY;  Surgeon: Grace Isaac, MD;  Location: [facility];  Service: Thoracic;  Laterality: N/A;    Past Medical Hx:  Past Medical History:  Diagnosis Date  . Anxiety   . Chronic bronchitis ([place])   . Depression   . GERD (gastroesophageal  reflux disease)   . Headache    "monthly" (09/19/2017) MIGRAINE  . Hidradenitis suppurativa   . Hypothyroidism    RESOLVED NO MEDS  . Irritable bowel syndrome (IBS)     Past Gynecological History:  See HPI No LMP recorded (lmp unknown). Patient has had a hysterectomy.  Family Hx:  Family History  Problem Relation Age of Onset  . Cirrhosis Mother   . Ulcerative colitis Mother   . Breast cancer Mother   . Colon cancer Maternal Grandfather     Review of Systems:  Constitutional  Feels well,    ENT Normal appearing ears and nares bilaterally Skin/Breast  No rash, sores, jaundice, itching, dryness Cardiovascular  No chest pain, shortness of breath, or edema  Pulmonary  No cough or wheeze.  Gastro Intestinal  No nausea or emesis.  Less rectal pain Genito Urinary  No frequency, urgency, dysuria, no bleeding Musculo Skeletal  No myalgia, arthralgia, joint swelling or pain  Neurologic  No weakness, numbness, change in gait, reports occasional hot flashes Psychology  No depression, anxiety, insomnia.   Vitals:  Blood pressure (!) 143/85, pulse 90, temperature 98.9 F (37.2 C), temperature source Oral, resp. rate 17, height 5\' 2"  (1.575 m), weight 213 lb 9.6 oz (96.9 kg), SpO2 100 %.  Physical Exam: WD in NAD Neck  Supple NROM, without any enlargements.  Lymph Node Survey No cervical supraclavicular or inguinal adenopathy.  Skin  No rash/lesions/breakdown  Psychiatry  Alert and oriented to person, place, and time  Abdomen  Normoactive bowel sounds, abdomen soft, non-tender and obese without evidence of hernia. Suture removed from LUQ port all the port sites are without hernia tenderness or masses Back No CVA tenderness Genito Urinary  Vulva/vagina: Normal external female genitalia.  No lesions. No discharge or bleeding.  Bladder/urethra:  No lesions or masses, well supported bladder  Adnexa: no masses, no nodularity.  Extremities  No bilateral cyanosis, clubbing  or edema.

## 2019-02-13 ENCOUNTER — Inpatient Hospital Stay: Payer: BC Managed Care – PPO | Attending: Gynecologic Oncology | Admitting: Gynecologic Oncology

## 2019-02-13 ENCOUNTER — Other Ambulatory Visit: Payer: Self-pay

## 2019-02-13 DIAGNOSIS — E039 Hypothyroidism, unspecified: Secondary | ICD-10-CM | POA: Insufficient documentation

## 2019-02-13 DIAGNOSIS — N80129 Deep endometriosis of ovary, unspecified ovary: Secondary | ICD-10-CM

## 2019-02-13 DIAGNOSIS — Z90710 Acquired absence of both cervix and uterus: Secondary | ICD-10-CM | POA: Diagnosis not present

## 2019-02-13 DIAGNOSIS — F329 Major depressive disorder, single episode, unspecified: Secondary | ICD-10-CM | POA: Diagnosis not present

## 2019-02-13 DIAGNOSIS — Z79899 Other long term (current) drug therapy: Secondary | ICD-10-CM | POA: Insufficient documentation

## 2019-02-13 DIAGNOSIS — L732 Hidradenitis suppurativa: Secondary | ICD-10-CM | POA: Insufficient documentation

## 2019-02-13 DIAGNOSIS — K219 Gastro-esophageal reflux disease without esophagitis: Secondary | ICD-10-CM | POA: Insufficient documentation

## 2019-02-13 DIAGNOSIS — N801 Endometriosis of ovary: Secondary | ICD-10-CM | POA: Diagnosis not present

## 2019-02-13 DIAGNOSIS — N951 Menopausal and female climacteric states: Secondary | ICD-10-CM | POA: Diagnosis not present

## 2019-02-13 DIAGNOSIS — N809 Endometriosis, unspecified: Secondary | ICD-10-CM | POA: Insufficient documentation

## 2019-02-13 DIAGNOSIS — Z90721 Acquired absence of ovaries, unilateral: Secondary | ICD-10-CM | POA: Diagnosis not present

## 2019-02-13 DIAGNOSIS — K589 Irritable bowel syndrome without diarrhea: Secondary | ICD-10-CM | POA: Diagnosis not present

## 2019-02-13 NOTE — Patient Instructions (Signed)
Thank you very much Katelyn Arnold for allowing me to provide care for you today.  I appreciate your confidence in choosing our Gynecologic Oncology team.  If you have any questions about your visit today please call our office and we will get back to you as soon as possible.  Please consider using the website Medlineplus.gov as an Geneticist, molecular. F/U with Dr. Nelda Marseille   Menopause and Hormone Replacement Therapy Menopause is a normal time of life when menstrual periods stop completely and the ovaries stop producing the female hormones estrogen and progesterone. This lack of hormones can affect your health and cause undesirable symptoms. Hormone replacement therapy (HRT) can relieve some of those symptoms. What is hormone replacement therapy? HRT is the use of artificial (synthetic) hormones to replace hormones that your body has stopped producing because you have reached menopause. What are my options for HRT?  HRT may consist of the synthetic hormones estrogen and progestin, or it may consist of only estrogen (estrogen-only therapy). You and your health care provider will decide which form of HRT is best for you. If you choose to be on HRT and you have a uterus, estrogen and progestin are usually prescribed. Estrogen-only therapy is used for women who do not have a uterus. Possible options for taking HRT include:  Pills.  Patches.  Gels.  Sprays.  Vaginal cream.  Vaginal rings.  Vaginal inserts. The amount of hormone(s) that you take and how long you take the hormone(s) varies according to your health. It is important to:  Begin HRT with the lowest possible dosage.  Stop HRT as soon as your health care provider tells you to stop.  Work with your health care provider so that you feel informed and comfortable with your decisions. What are the benefits of HRT? HRT can reduce the frequency and severity of menopausal symptoms. Benefits of HRT vary according to the kind  of symptoms that you have, how severe they are, and your overall health. HRT may help to improve the following symptoms of menopause:  Hot flashes and night sweats. These are sudden feelings of heat that spread over the face and body. The skin may turn red, like a blush. Night sweats are hot flashes that happen while you are sleeping or trying to sleep.  Bone loss (osteoporosis). The body loses calcium more quickly after menopause, causing the bones to become weaker. This can increase the risk for bone breaks (fractures).  Vaginal dryness. The lining of the vagina can become thin and dry, which can cause pain during sex or cause infection, burning, or itching.  Urinary tract infections.  Urinary incontinence. This is the inability to control when you pass urine.  Irritability.  Short-term memory problems. What are the risks of HRT? Risks of HRT vary depending on your individual health and medical history. Risks of HRT also depend on whether you receive both estrogen and progestin or you receive estrogen only. HRT may increase the risk of:  Spotting. This is when a small amount of blood leaks from the vagina unexpectedly.  Endometrial cancer. This cancer is in the lining of the uterus (endometrium).  Breast cancer.  Increased density of breast tissue. This can make it harder to find breast cancer on a breast X-ray (mammogram).  Stroke.  Heart disease.  Blood clots.  Gallbladder disease.  Liver disease. Risks of HRT can increase if you have any of the following conditions:  Endometrial cancer.  Liver disease.  Heart disease.  Breast  cancer.  History of blood clots.  History of stroke. Follow these instructions at home:  Take over-the-counter and prescription medicines only as told by your health care provider.  Get mammograms, pelvic exams, and medical checkups as often as told by your health care provider.  Have Pap tests done as often as told by your health care  provider. A Pap test is sometimes called a Pap smear. It is a screening test that is used to check for signs of cancer of the cervix and vagina. A Pap test can also identify the presence of infection or precancerous changes. Pap tests may be done: ? Every 3 years, starting at age 42. ? Every 5 years, starting after age 42, in combination with testing for human papillomavirus (HPV). ? More often or less often depending on other medical conditions you have, your age, and other risk factors.  It is up to you to get the results of your Pap test. Ask your health care provider, or the department that is doing the test, when your results will be ready.  Keep all follow-up visits as told by your health care provider. This is important. Contact a health care provider if you have:  Pain or swelling in your legs.  Shortness of breath.  Chest pain.  Lumps or changes in your breasts or armpits.  Slurred speech.  Pain, burning, or bleeding when you urinate.  Unusual vaginal bleeding.  Dizziness or headaches.  Weakness or numbness in any part of your arms or legs.  Pain in your abdomen. Summary  Menopause is a normal time of life when menstrual periods stop completely and the ovaries stop producing the female hormones estrogen and progesterone.  Hormone replacement therapy (HRT) can relieve some of the symptoms of menopause.  HRT can reduce the frequency and severity of menopausal symptoms.  Risks of HRT vary depending on your individual health and medical history. This information is not intended to replace advice given to you by your health care provider. Make sure you discuss any questions you have with your health care provider. Document Released: 02/25/2003 Document Revised: 01/29/2018 Document Reviewed: 01/29/2018 Elsevier Patient Education  2020 [employer]   BurnsWendy R. Rolando Whitby MD., PhD Gynecologic Oncology

## 2019-02-19 ENCOUNTER — Telehealth: Payer: Self-pay

## 2019-02-19 NOTE — Telephone Encounter (Signed)
Faxed return to work letter for 02-19-19 to employer as noted above.

## 2019-05-12 ENCOUNTER — Other Ambulatory Visit: Payer: Self-pay | Admitting: Family Medicine

## 2019-05-12 DIAGNOSIS — Z1231 Encounter for screening mammogram for malignant neoplasm of breast: Secondary | ICD-10-CM

## 2019-05-19 ENCOUNTER — Other Ambulatory Visit: Payer: Self-pay

## 2019-05-19 ENCOUNTER — Ambulatory Visit
Admission: RE | Admit: 2019-05-19 | Discharge: 2019-05-19 | Disposition: A | Discharge status: Home / Self Care | Payer: BC Managed Care – PPO | Source: Ambulatory Visit | Attending: Family Medicine | Admitting: Family Medicine

## 2019-05-19 DIAGNOSIS — Z1231 Encounter for screening mammogram for malignant neoplasm of breast: Secondary | ICD-10-CM

## 2019-06-12 ENCOUNTER — Other Ambulatory Visit: Payer: Self-pay

## 2019-06-12 DIAGNOSIS — Z20822 Contact with and (suspected) exposure to covid-19: Secondary | ICD-10-CM

## 2019-06-13 LAB — NOVEL CORONAVIRUS, NAA: SARS-CoV-2, NAA: NOT DETECTED

## 2020-06-25 IMAGING — MG DIGITAL SCREENING BILAT W/ TOMO W/ CAD
8 series · 8 of 24 positions shown · non-contrast
Comparison: Previous exam(s).

CLINICAL DATA: Screening.

EXAM:
DIGITAL SCREENING BILATERAL MAMMOGRAM WITH TOMO AND CAD

[L CC synth-2D]
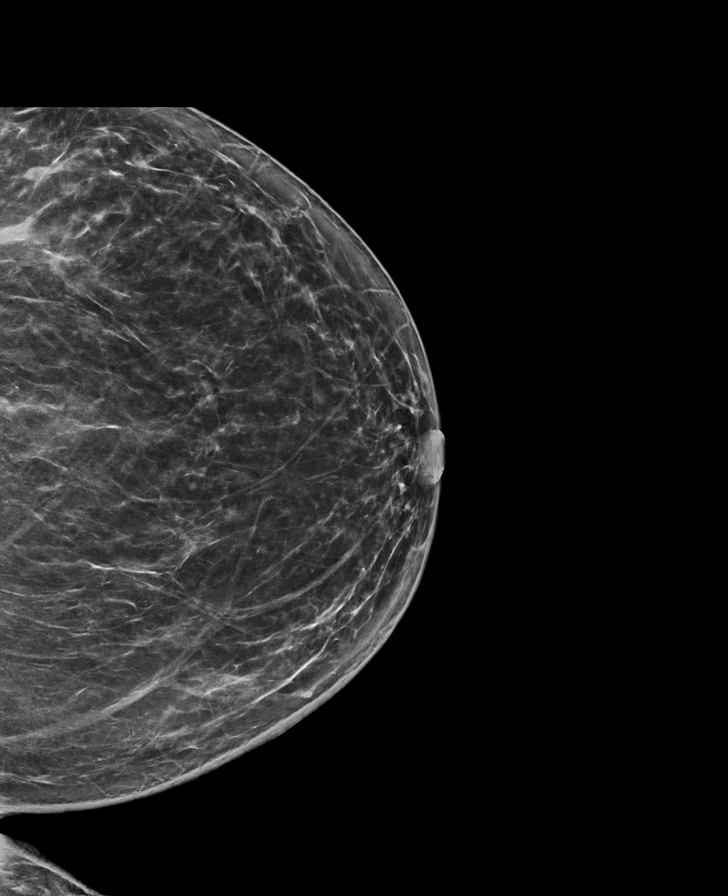

[L MLO synth-2D]
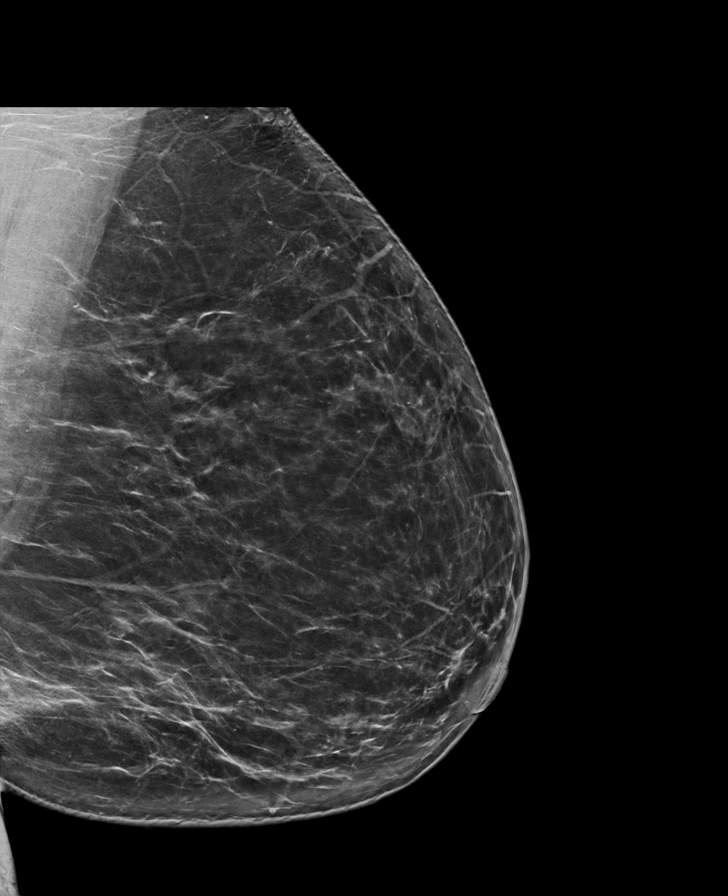

[R MLO synth-2D]
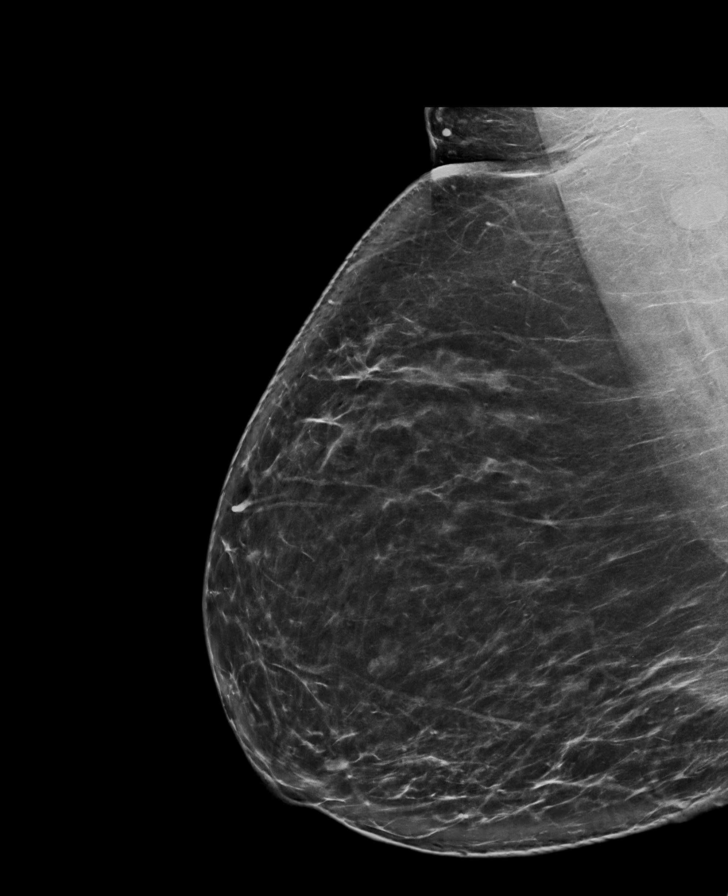

[R CC synth-2D]
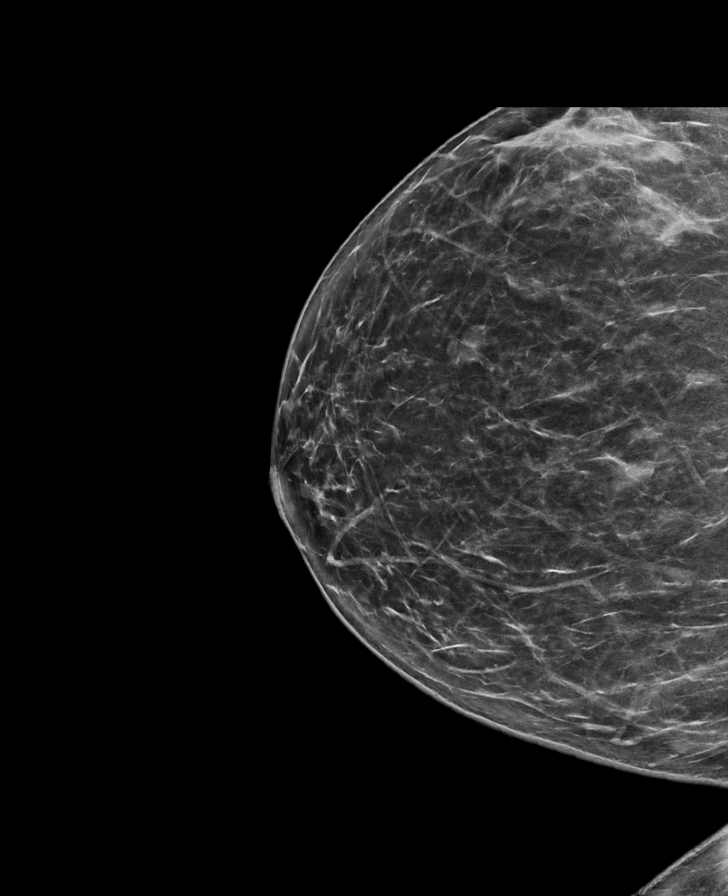

[R CC tomo · tomo slice 38/75.0]
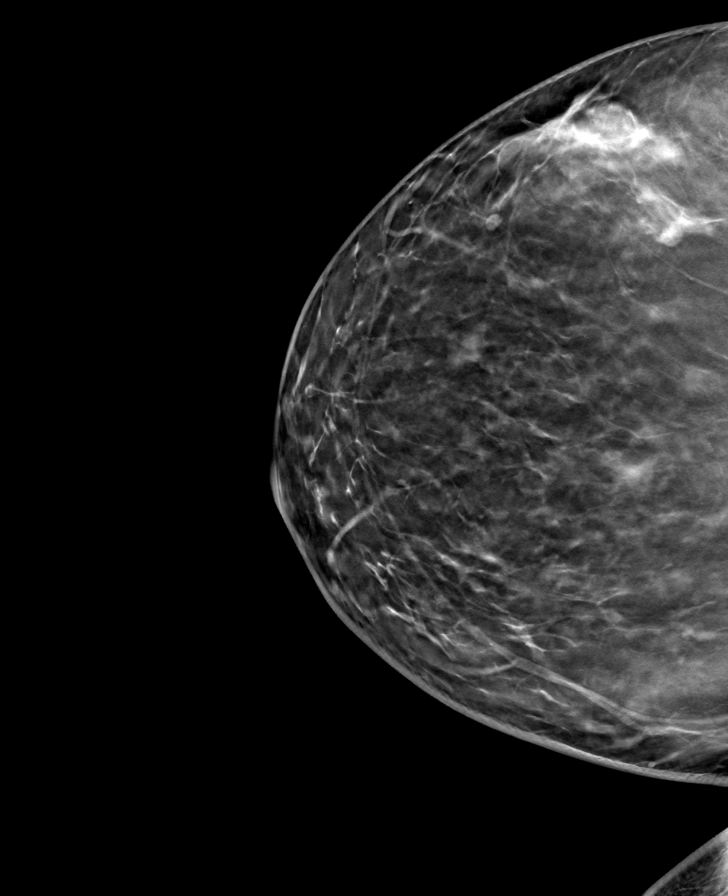

[L MLO tomo · tomo slice 45/88.0]
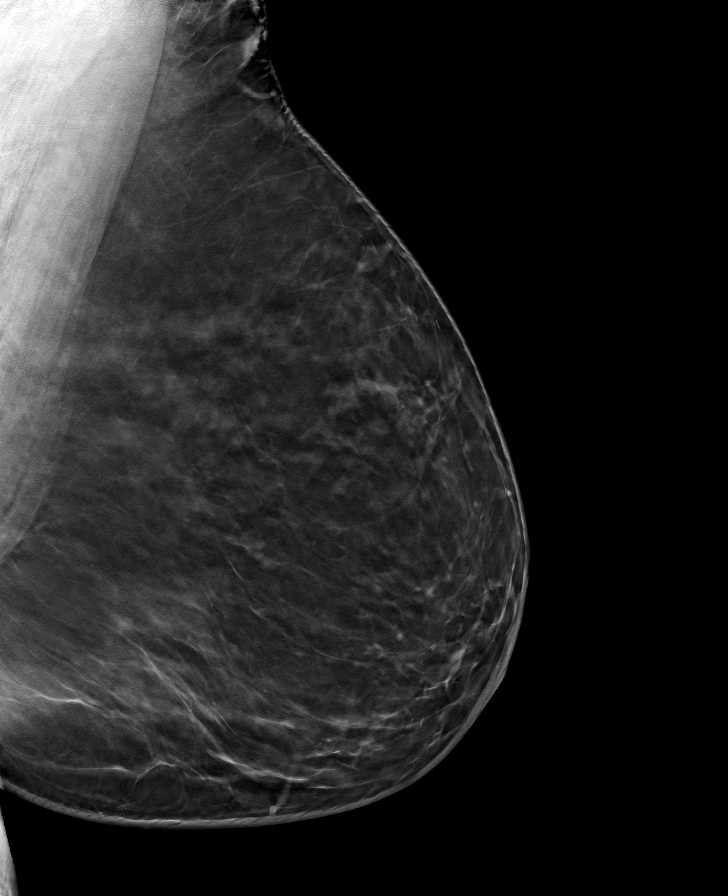

[L CC tomo · tomo slice 39/78.0]
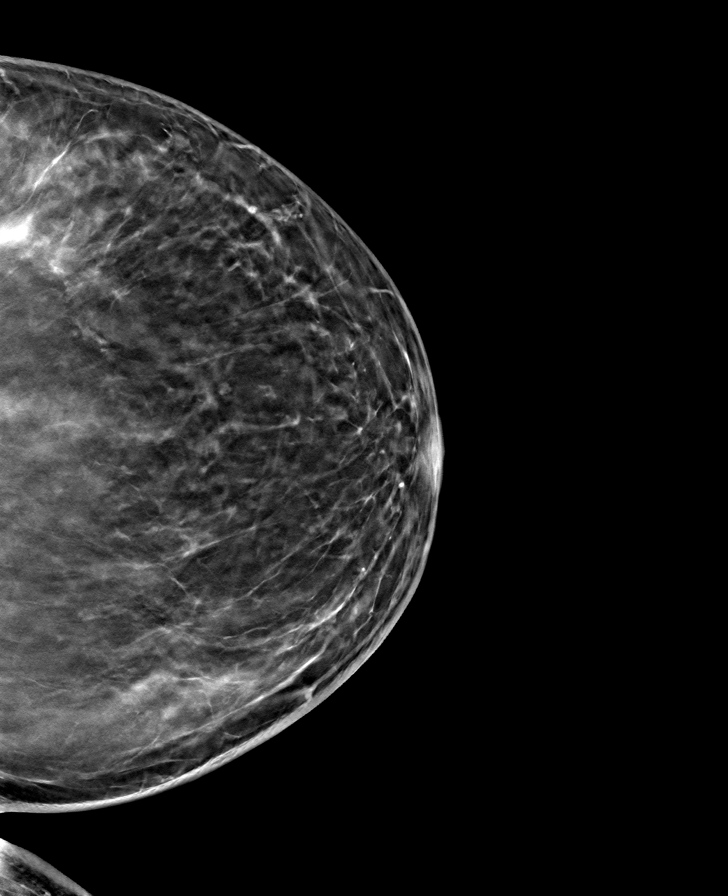

[R MLO tomo · tomo slice 46/91.0]
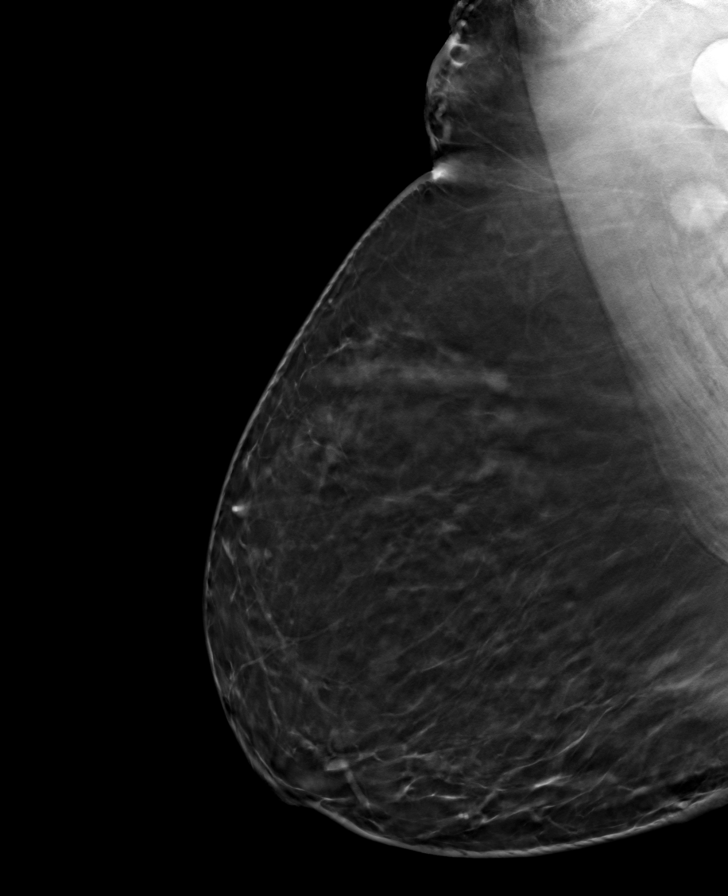

[8 of 24 positions shown; findings below may reference images not displayed]

ACR Breast Density Category b: There are scattered areas of
fibroglandular density.
FINDINGS: There are no findings suspicious for malignancy. Images were
processed with CAD.
IMPRESSION: No mammographic evidence of malignancy. A result letter of this
screening mammogram will be mailed directly to the patient.

RECOMMENDATION:
Screening mammogram in one year. (Code:[id number])

BI-RADS CATEGORY  1: Negative.

## 2023-12-18 ENCOUNTER — Inpatient Hospital Stay: Payer: Self-pay

## 2023-12-18 ENCOUNTER — Encounter: Payer: Self-pay | Admitting: Genetic Counselor

## 2023-12-18 ENCOUNTER — Other Ambulatory Visit: Payer: Self-pay | Admitting: Genetic Counselor

## 2023-12-18 ENCOUNTER — Inpatient Hospital Stay: Payer: Self-pay | Attending: Genetic Counselor | Admitting: Genetic Counselor

## 2023-12-18 DIAGNOSIS — Z800 Family history of malignant neoplasm of digestive organs: Secondary | ICD-10-CM

## 2023-12-18 DIAGNOSIS — Z803 Family history of malignant neoplasm of breast: Secondary | ICD-10-CM

## 2023-12-18 DIAGNOSIS — Z1379 Encounter for other screening for genetic and chromosomal anomalies: Secondary | ICD-10-CM

## 2023-12-18 LAB — GENETIC SCREENING ORDER

## 2023-12-18 NOTE — Progress Notes (Signed)
 REFERRING PROVIDER: Steen Santana CROME, MD [address] [place],  [place] [address]  PRIMARY PROVIDER:  Marvene Prentice SAUNDERS, FNP  PRIMARY REASON FOR VISIT:  1. Family history of breast cancer   2. Family history of colon cancer    HISTORY OF PRESENT ILLNESS:   Katelyn Arnold, a 48 y.o. female, was seen for a Katelyn Arnold cancer genetics consultation at the request of Dr. Steen due to a family history of cancer.  Katelyn Arnold presents to clinic today to discuss the possibility of a hereditary predisposition to cancer, to discuss genetic testing, and to further clarify her future cancer risks, as well as potential cancer risks for family members.   Katelyn Arnold is a 48 y.o. female with no personal history of cancer.    RISK FACTORS:  Menarche was at age 61.  First live birth at age n/a.  OCP use for approximately 10+ years.  Ovaries intact: no.  Uterus intact: no.  Menopausal status: perimenopausal.  HRT use: currently, started in 2021  Colonoscopy: no Mammogram within the last year: yes. Number of breast biopsies: 0. Any excessive radiation exposure in the past: no  Past Medical History:  Diagnosis Date   Anxiety    Chronic bronchitis (HCC)    Depression    GERD (gastroesophageal reflux disease)    Headache    monthly (09/19/2017) MIGRAINE   Hidradenitis suppurativa    Hypothyroidism    RESOLVED NO MEDS   Irritable bowel syndrome (IBS)     Past Surgical History:  Procedure Laterality Date   ABDOMINAL HYSTERECTOMY  2013   LAPAROSCOPIC HYSTERECTOMY   ABLATION ON ENDOMETRIOSIS Bilateral 01/23/2019   Procedure: ABLATION ON ENDOMETRIOSIS;  Surgeon: Jerrol Harvey, MD;  Location: [facility];  Service: Gynecology;  Laterality: Bilateral;   DIAPHRAGMATIC HERNIA REPAIR  09/19/2017   VIDEO BRONCHOSCOPY; VATS   DILATION AND CURETTAGE OF UTERUS     REPAIR OF ESOPHAGUS Right 09/19/2017   Procedure: REPAIR OF DIAPHRAGMATIC HERNIA;  Surgeon: Army Dallas NOVAK, MD;  Location: [facility] OR;   Service: Thoracic;  Laterality: Right;   ROBOTIC ASSISTED SALPINGO OOPHERECTOMY Left 01/23/2019   Procedure: XI ROBOTIC ASSISTED LEFT SALPINGO OOPHORECTOMY,;  Surgeon: Jerrol Harvey, MD;  Location: [facility];  Service: Gynecology;  Laterality: Left;  resection of retroperitoneal mass.   VIDEO ASSISTED THORACOSCOPY Right 09/19/2017   Procedure: VIDEO ASSISTED THORACOSCOPY;  Surgeon: Army Dallas NOVAK, MD;  [facility];  Service: Thoracic;  Laterality: Right;   VIDEO BRONCHOSCOPY N/A 09/19/2017   Procedure: VIDEO BRONCHOSCOPY;  Surgeon: Army Dallas NOVAK, MD;  Location: [facility] OR;  Service: Thoracic;  Laterality: N/A;    Social History   Socioeconomic History   Marital status: Divorced    Spouse name: Not on file   Number of children: Not on file   Years of education: Not on file   Highest education level: Not on file  Occupational History   Not on file  Tobacco Use   Smoking status: Never   Smokeless tobacco: Never  Vaping Use   Vaping status: Never Used  Substance and Sexual Activity   Alcohol use: Yes    Alcohol/week: 2.0 standard drinks of alcohol    Types: 2 Glasses of wine per week   Drug use: Never   Sexual activity: Yes  Other Topics Concern   Not on file  Social History Narrative   Not on file   Social Drivers of Health   Financial Resource Strain: Not on file  Food Insecurity: Not on  file  Transportation Needs: Not on file  Physical Activity: Not on file  Stress: Not on file  Social Connections: Not on file     FAMILY HISTORY:  We obtained a detailed, 4-generation family history.  Significant diagnoses are listed below: Family History  Problem Relation Age of Onset   Cirrhosis Mother    Ulcerative colitis Mother    Breast cancer Mother 65 - 73   Colon cancer Maternal Grandfather 73 - 61     Katelyn Arnold is unaware of previous family history of genetic testing for hereditary cancer risks. There is no reported Ashkenazi Jewish ancestry.   GENETIC  COUNSELING ASSESSMENT: Katelyn Arnold is a 48 y.o. female with a family history of cancer which is somewhat suggestive of a hereditary predisposition to cancer given her mother's young age at diagnosis. We, therefore, discussed and recommended the following at today's visit.   DISCUSSION: We discussed that 5 - 10% of cancer is hereditary, with most cases of hereditary breast cancer associated with BRCA1/2.  There are other genes that can be associated with hereditary breast cancer syndromes.  We discussed that testing is beneficial for several reasons, including knowing about other cancer risks, identifying potential screening and risk-reduction options that may be appropriate, and to understanding if other family members could be at risk for cancer and allowing them to undergo genetic testing.  We reviewed the characteristics, features and inheritance patterns of hereditary cancer syndromes. We also discussed genetic testing, including the appropriate family members to test, the process of testing, insurance coverage and turn-around-time for results. We discussed the implications of a negative, positive, carrier and/or variant of uncertain significant result. We discussed that negative results would be uninformative given that Katelyn Arnold does not have a personal history of cancer. We recommended Katelyn Arnold pursue genetic testing for a panel that contains genes associated with breast and colon cancer.  Katelyn Arnold was offered a common hereditary cancer panel (40 genes) and an expanded pan-cancer panel (77 genes). Katelyn Arnold was informed of the benefits and limitations of each panel, including that expanded pan-cancer panels contain several genes that do not have clear management guidelines at this point in time.  We also discussed that as the number of genes included on a panel increases, the chances of variants of uncertain significance increases.  After considering the benefits and limitations of each  gene panel, Katelyn Arnold elected to have [facility] CancerNext-Expanded Panel.  The CancerNext-Expanded gene panel offered by [facility] and includes sequencing, rearrangement, and RNA analysis for the following 77 genes: AIP, ALK, APC, ATM, AXIN2, BAP1, BARD1, BMPR1A, BRCA1, BRCA2, BRIP1, CDC73, CDH1, CDK4, CDKN1B, CDKN2A, CEBPA, CHEK2, CTNNA1, DDX41, DICER1, ETV6, FH, FLCN, GATA2, LZTR1, MAX, MBD4, MEN1, MET, MLH1, MSH2, MSH3, MSH6, MUTYH, NF1, NF2, NTHL1, PALB2, PHOX2B, PMS2, POT1, PRKAR1A, PTCH1, PTEN, RAD51C, RAD51D, RB1, RET, RPS20, RUNX1, SDHA, SDHAF2, SDHB, SDHC, SDHD, SMAD4, SMARCA4, SMARCB1, SMARCE1, STK11, SUFU, TMEM127, TP53, TSC1, TSC2, VHL, and WT1 (sequencing and deletion/duplication); EGFR, HOXB13, KIT, MITF, PDGFRA, POLD1, and POLE (sequencing only); EPCAM and GREM1 (deletion/duplication only).    Based on Ms. Stefan's family history of cancer, she meets medical criteria for genetic testing. Though Ms. Mayeda is not personally affected, there are no affected family members that are willing/able to undergo hereditary cancer testing. Her testing has been approved through the [place].  We discussed that some people do not want to undergo genetic testing due to fear of genetic discrimination.  A federal law called the [employer]  Non-Discrimination Act (GINA) of 2008 helps protect individuals against genetic discrimination based on their genetic test results.  It impacts both health insurance and employment.  With health insurance, it protects against increased premiums, being kicked off insurance or being forced to take a test in order to be insured.  For employment it protects against hiring, firing and promoting decisions based on genetic test results.  GINA does not apply to those in the [employer], those who work for companies with less than 15 employees, and new life insurance or long-term disability insurance policies.  Health status due to a cancer diagnosis is not protected under  GINA.  PLAN: After considering the risks, benefits, and limitations, Katelyn Arnold provided informed consent to pursue genetic testing and the blood sample was sent to [facility] for analysis of the CancerNext-Expanded Panel. Results should be available within approximately 2-3 weeks' time, at which point they will be disclosed by telephone to Katelyn Arnold, as will any additional recommendations warranted by these results. Katelyn Arnold will receive a summary of her genetic counseling visit and a copy of her results once available. This information will also be available in Epic.   Katelyn Arnold questions were answered to her satisfaction today. Our contact information was provided should additional questions or concerns arise. Thank you for the referral and allowing us  to share in the care of your patient.   Jammie Troup, MS, [facility] Genetic Counselor [email or web address] (P) [phone]  60 minutes were spent on the date of the encounter in service to the patient including preparation, face-to-face consultation, documentation and care coordination. The patient was seen alone.  Drs. Gudena and/or Lanny were available to discuss this case as needed.  _______________________________________________________________________ For Office Staff:  Number of people involved in session: 1 Was an Intern/ student involved with case: no

## 2024-01-11 ENCOUNTER — Encounter: Payer: Self-pay | Admitting: Genetic Counselor

## 2024-01-11 ENCOUNTER — Telehealth: Payer: Self-pay | Admitting: Genetic Counselor

## 2024-01-11 DIAGNOSIS — Z1379 Encounter for other screening for genetic and chromosomal anomalies: Secondary | ICD-10-CM | POA: Insufficient documentation

## 2024-01-11 NOTE — Telephone Encounter (Signed)
I contacted Katelyn Arnold to discuss her genetic testing results. No pathogenic variants were identified in the 77 genes analyzed. Detailed clinic note to follow.  The test report has been scanned into EPIC and is located under the Molecular Pathology section of the Results Review tab.  A portion of the result report is included below for reference.   Lucille Passy, MS, [facility] Genetic Counselor [email or web address] (P) [phone]

## 2024-01-18 ENCOUNTER — Ambulatory Visit: Payer: Self-pay | Admitting: Genetic Counselor

## 2024-01-18 NOTE — Progress Notes (Signed)
 HPI:   Katelyn Arnold was previously seen in the [facility] due to a family history of cancer and concerns regarding a hereditary predisposition to cancer. Please refer to our prior cancer genetics clinic note for more information regarding our discussion, assessment and recommendations, at the time. Katelyn Arnold recent genetic test results were disclosed to her, as were recommendations warranted by these results. These results and recommendations are discussed in more detail below.  CANCER HISTORY:  Oncology History   No history exists.    FAMILY HISTORY:  We obtained a detailed, 4-generation family history.  Significant diagnoses are listed below:      Family History  Problem Relation Age of Onset   Cirrhosis Mother     Ulcerative colitis Mother     Breast cancer Mother 64 - 23   Colon cancer Maternal Grandfather 66 - 51           Katelyn Arnold is unaware of previous family history of genetic testing for hereditary cancer risks. There is no reported Ashkenazi Jewish ancestry.    GENETIC TEST RESULTS:  The [facility] CancerNext-Expanded Panel found no pathogenic mutations.   The CancerNext-Expanded gene panel offered by [facility] and includes sequencing, rearrangement, and RNA analysis for the following 77 genes: AIP, ALK, APC, ATM, AXIN2, BAP1, BARD1, BMPR1A, BRCA1, BRCA2, BRIP1, CDC73, CDH1, CDK4, CDKN1B, CDKN2A, CEBPA, CHEK2, CTNNA1, DDX41, DICER1, ETV6, FH, FLCN, GATA2, LZTR1, MAX, MBD4, MEN1, MET, MLH1, MSH2, MSH3, MSH6, MUTYH, NF1, NF2, NTHL1, PALB2, PHOX2B, PMS2, POT1, PRKAR1A, PTCH1, PTEN, RAD51C, RAD51D, RB1, RET, RPS20, RUNX1, SDHA, SDHAF2, SDHB, SDHC, SDHD, SMAD4, SMARCA4, SMARCB1, SMARCE1, STK11, SUFU, TMEM127, TP53, TSC1, TSC2, VHL, and WT1 (sequencing and deletion/duplication); EGFR, HOXB13, KIT, MITF, PDGFRA, POLD1, and POLE (sequencing only); EPCAM and GREM1 (deletion/duplication only).    The test report has been scanned into EPIC and is located  under the Molecular Pathology section of the Results Review tab.  A portion of the result report is included below for reference. Genetic testing reported out on 01/04/2024.       Even though a pathogenic variant was not identified, possible explanations for the cancer in the family may include: There may be no hereditary risk for cancer in the family. The cancers in her family may be due to other genetic or environmental factors. There may be a gene mutation in one of these genes that current testing methods cannot detect, but that chance is small. There could be another gene that has not yet been discovered, or that we have not yet tested, that is responsible for the cancer diagnoses in the family.  It is also possible there is a hereditary cause for the cancer in the family that Katelyn Arnold did not inherit.  Therefore, it is important to remain in touch with cancer genetics in the future so that we can continue to offer Katelyn Arnold the most up to date genetic testing.   ADDITIONAL GENETIC TESTING:  We discussed with Katelyn Arnold that her genetic testing was fairly extensive.  If there are genes identified to increase cancer risk that can be analyzed in the future, we would be happy to discuss and coordinate this testing at that time.    CANCER SCREENING RECOMMENDATIONS:  Katelyn Arnold test result is considered negative (normal).  This means that we have not identified a hereditary cause for her family history of cancer at this time.   An individual's cancer risk and medical management are not determined by genetic test  results alone. Overall cancer risk assessment incorporates additional factors, including personal medical history, family history, and any available genetic information that may result in a personalized plan for cancer prevention and surveillance. Therefore, it is recommended she continue to follow the cancer management and screening guidelines provided by her primary  healthcare provider.  Based on the reported personal and family history, specific cancer screenings for Katelyn Arnold and her family include:  Breast Cancer Screening:  The Arnold model is one of multiple prediction models developed to estimate an individual's lifetime risk of developing breast cancer. The Arnold model is endorsed by the [employer] ([employer]CCN). This model includes many risk factors such as family history, endogenous estrogen exposure, and benign breast disease. The calculation is highly-dependent on the accuracy of clinical data provided by the patient and can change over time. The Arnold model may be repeated to reflect new information in her personal or family history in the future.   Katelyn Arnold risk score is 15.8%. This is above the general population risk of 12%, therefore, she is encouraged to continue to be mindful of her family history and be diligent with general population breast screening, including annual mammograms.  She is encouraged to contact us  regarding any changes to her personal or family history, as her recommendations for screening would be altered significantly if her lifetime risk is determined to be greater than 20% based on updated information.    RECOMMENDATIONS FOR FAMILY MEMBERS:   Individuals in this family might be at some increased risk of developing cancer, over the general population risk, due to the family history of cancer. We recommend women in this family have a yearly mammogram beginning at age 66, or 30 years younger than the earliest onset of cancer, an annual clinical breast exam, and perform monthly breast self-exams.  Other members of the family may still carry a pathogenic variant in one of these genes that Katelyn Arnold did not inherit. Based on the family history, we recommend her siblings have genetic counseling and testing.   FOLLOW-UP:  Cancer genetics is a rapidly advancing  field and it is possible that new genetic tests will be appropriate for her and/or her family members in the future. We encouraged her to remain in contact with cancer genetics on an annual basis so we can update her personal and family histories and let her know of advances in cancer genetics that may benefit this family.   Our contact number was provided. Ms. Bergquist questions were answered to her satisfaction, and she knows she is welcome to call us  at anytime with additional questions or concerns.   Katelyn Todisco, MS, [facility] Genetic Counselor [email or web address] (P) [phone]
